# Patient Record
Sex: Female | Born: 1970
Health system: Southern US, Community
[De-identification: ages and names within clinical notes are randomized; demographics above are authoritative.]

## PROBLEM LIST (undated history)

## (undated) DIAGNOSIS — T7840XA Allergy, unspecified, initial encounter: Secondary | ICD-10-CM

## (undated) DIAGNOSIS — F329 Major depressive disorder, single episode, unspecified: Secondary | ICD-10-CM

## (undated) DIAGNOSIS — L700 Acne vulgaris: Secondary | ICD-10-CM

## (undated) DIAGNOSIS — F32A Depression, unspecified: Secondary | ICD-10-CM

## (undated) DIAGNOSIS — N761 Subacute and chronic vaginitis: Secondary | ICD-10-CM

## (undated) DIAGNOSIS — N390 Urinary tract infection, site not specified: Secondary | ICD-10-CM

## (undated) DIAGNOSIS — N362 Urethral caruncle: Secondary | ICD-10-CM

## (undated) DIAGNOSIS — F419 Anxiety disorder, unspecified: Secondary | ICD-10-CM

## (undated) DIAGNOSIS — Z8619 Personal history of other infectious and parasitic diseases: Secondary | ICD-10-CM

## (undated) DIAGNOSIS — K219 Gastro-esophageal reflux disease without esophagitis: Secondary | ICD-10-CM

## (undated) DIAGNOSIS — M1990 Unspecified osteoarthritis, unspecified site: Secondary | ICD-10-CM

## (undated) HISTORY — DX: Unspecified osteoarthritis, unspecified site: M19.90

## (undated) HISTORY — DX: Urinary tract infection, site not specified: N39.0

## (undated) HISTORY — DX: Depression, unspecified: F32.A

## (undated) HISTORY — DX: Anxiety disorder, unspecified: F41.9

## (undated) HISTORY — DX: Acne vulgaris: L70.0

## (undated) HISTORY — DX: Major depressive disorder, single episode, unspecified: F32.9

## (undated) HISTORY — DX: Allergy, unspecified, initial encounter: T78.40XA

## (undated) HISTORY — DX: Gastro-esophageal reflux disease without esophagitis: K21.9

## (undated) HISTORY — DX: Urethral caruncle: N36.2

## (undated) HISTORY — DX: Subacute and chronic vaginitis: N76.1

## (undated) HISTORY — DX: Personal history of other infectious and parasitic diseases: Z86.19

---

## 2005-01-08 ENCOUNTER — Emergency Department: Payer: Self-pay | Admitting: Emergency Medicine

## 2005-07-25 ENCOUNTER — Inpatient Hospital Stay: Payer: Self-pay

## 2007-11-07 ENCOUNTER — Ambulatory Visit: Payer: Self-pay | Admitting: Obstetrics and Gynecology

## 2008-07-30 ENCOUNTER — Other Ambulatory Visit: Admission: RE | Admit: 2008-07-30 | Discharge: 2008-07-30 | Payer: Self-pay | Admitting: Obstetrics and Gynecology

## 2008-07-30 ENCOUNTER — Ambulatory Visit: Payer: Self-pay | Admitting: Obstetrics and Gynecology

## 2008-07-30 ENCOUNTER — Encounter: Payer: Self-pay | Admitting: Obstetrics and Gynecology

## 2010-05-04 ENCOUNTER — Encounter (INDEPENDENT_AMBULATORY_CARE_PROVIDER_SITE_OTHER): Payer: Medicaid Other | Admitting: Obstetrics and Gynecology

## 2010-05-04 ENCOUNTER — Other Ambulatory Visit (HOSPITAL_COMMUNITY)
Admission: RE | Admit: 2010-05-04 | Discharge: 2010-05-04 | Disposition: A | Discharge status: Home / Self Care | Payer: Medicaid Other | Source: Ambulatory Visit | Attending: Obstetrics and Gynecology | Admitting: Obstetrics and Gynecology

## 2010-05-04 ENCOUNTER — Other Ambulatory Visit: Payer: Self-pay | Admitting: Obstetrics and Gynecology

## 2010-05-04 DIAGNOSIS — Z3041 Encounter for surveillance of contraceptive pills: Secondary | ICD-10-CM

## 2010-05-04 DIAGNOSIS — Z124 Encounter for screening for malignant neoplasm of cervix: Secondary | ICD-10-CM | POA: Insufficient documentation

## 2010-05-24 ENCOUNTER — Ambulatory Visit (INDEPENDENT_AMBULATORY_CARE_PROVIDER_SITE_OTHER): Payer: Medicaid Other | Admitting: Women's Health

## 2010-05-24 DIAGNOSIS — N920 Excessive and frequent menstruation with regular cycle: Secondary | ICD-10-CM

## 2010-05-24 DIAGNOSIS — F329 Major depressive disorder, single episode, unspecified: Secondary | ICD-10-CM

## 2010-05-24 DIAGNOSIS — N943 Premenstrual tension syndrome: Secondary | ICD-10-CM

## 2014-07-28 ENCOUNTER — Encounter: Payer: Self-pay | Admitting: Obstetrics and Gynecology

## 2014-10-26 ENCOUNTER — Other Ambulatory Visit: Payer: Self-pay | Admitting: Family Medicine

## 2014-10-26 DIAGNOSIS — R1011 Right upper quadrant pain: Secondary | ICD-10-CM

## 2014-10-29 ENCOUNTER — Ambulatory Visit
Admission: RE | Admit: 2014-10-29 | Discharge: 2014-10-29 | Disposition: A | Discharge status: Home / Self Care | Payer: BLUE CROSS/BLUE SHIELD | Source: Ambulatory Visit | Attending: Family Medicine | Admitting: Family Medicine

## 2014-10-29 DIAGNOSIS — R1011 Right upper quadrant pain: Secondary | ICD-10-CM | POA: Insufficient documentation

## 2015-01-13 ENCOUNTER — Ambulatory Visit (INDEPENDENT_AMBULATORY_CARE_PROVIDER_SITE_OTHER): Payer: BLUE CROSS/BLUE SHIELD | Admitting: Family Medicine

## 2015-01-13 ENCOUNTER — Encounter: Payer: Self-pay | Admitting: Family Medicine

## 2015-01-13 VITALS — BP 110/74 | HR 72 | Temp 98.3°F | Ht 66.25 in | Wt 175.2 lb

## 2015-01-13 DIAGNOSIS — F3281 Premenstrual dysphoric disorder: Secondary | ICD-10-CM | POA: Insufficient documentation

## 2015-01-13 DIAGNOSIS — L682 Localized hypertrichosis: Secondary | ICD-10-CM

## 2015-01-13 DIAGNOSIS — Z8249 Family history of ischemic heart disease and other diseases of the circulatory system: Secondary | ICD-10-CM | POA: Insufficient documentation

## 2015-01-13 DIAGNOSIS — G4700 Insomnia, unspecified: Secondary | ICD-10-CM

## 2015-01-13 DIAGNOSIS — B3731 Acute candidiasis of vulva and vagina: Secondary | ICD-10-CM

## 2015-01-13 DIAGNOSIS — L700 Acne vulgaris: Secondary | ICD-10-CM

## 2015-01-13 DIAGNOSIS — D259 Leiomyoma of uterus, unspecified: Secondary | ICD-10-CM | POA: Insufficient documentation

## 2015-01-13 DIAGNOSIS — K219 Gastro-esophageal reflux disease without esophagitis: Secondary | ICD-10-CM

## 2015-01-13 DIAGNOSIS — B373 Candidiasis of vulva and vagina: Secondary | ICD-10-CM

## 2015-01-13 DIAGNOSIS — F5104 Psychophysiologic insomnia: Secondary | ICD-10-CM | POA: Insufficient documentation

## 2015-01-13 DIAGNOSIS — M2550 Pain in unspecified joint: Secondary | ICD-10-CM | POA: Diagnosis not present

## 2015-01-13 DIAGNOSIS — J309 Allergic rhinitis, unspecified: Secondary | ICD-10-CM | POA: Insufficient documentation

## 2015-01-13 DIAGNOSIS — L678 Other hair color and hair shaft abnormalities: Secondary | ICD-10-CM | POA: Insufficient documentation

## 2015-01-13 LAB — CBC WITH DIFFERENTIAL/PLATELET
BASOS PCT: 0.6 % (ref 0.0–3.0)
Basophils Absolute: 0 10*3/uL (ref 0.0–0.1)
EOS PCT: 1.2 % (ref 0.0–5.0)
Eosinophils Absolute: 0.1 10*3/uL (ref 0.0–0.7)
HEMATOCRIT: 42.3 % (ref 36.0–46.0)
HEMOGLOBIN: 14 g/dL (ref 12.0–15.0)
LYMPHS PCT: 32.5 % (ref 12.0–46.0)
Lymphs Abs: 1.9 10*3/uL (ref 0.7–4.0)
MCHC: 33.2 g/dL (ref 30.0–36.0)
MCV: 88.1 fl (ref 78.0–100.0)
Monocytes Absolute: 0.4 10*3/uL (ref 0.1–1.0)
Monocytes Relative: 7 % (ref 3.0–12.0)
Neutro Abs: 3.4 10*3/uL (ref 1.4–7.7)
Neutrophils Relative %: 58.7 % (ref 43.0–77.0)
Platelets: 200 10*3/uL (ref 150.0–400.0)
RBC: 4.8 Mil/uL (ref 3.87–5.11)
RDW: 12.8 % (ref 11.5–15.5)
WBC: 5.8 10*3/uL (ref 4.0–10.5)

## 2015-01-13 LAB — LIPID PANEL
CHOLESTEROL: 165 mg/dL (ref 0–200)
HDL: 52.7 mg/dL (ref >39.00)
LDL CALC: 102 mg/dL — AB (ref 0–99)
NonHDL: 111.87
TRIGLYCERIDES: 47 mg/dL (ref 0.0–149.0)
Total CHOL/HDL Ratio: 3
VLDL: 9.4 mg/dL (ref 0.0–40.0)

## 2015-01-13 LAB — COMPREHENSIVE METABOLIC PANEL
ALBUMIN: 4.5 g/dL (ref 3.5–5.2)
ALT: 14 U/L (ref 0–35)
AST: 19 U/L (ref 0–37)
Alkaline Phosphatase: 46 U/L (ref 39–117)
BUN: 9 mg/dL (ref 6–23)
CALCIUM: 9.6 mg/dL (ref 8.4–10.5)
CHLORIDE: 106 meq/L (ref 96–112)
CO2: 27 mEq/L (ref 19–32)
Creatinine, Ser: 0.67 mg/dL (ref 0.40–1.20)
GFR: 102.42 mL/min (ref >60.00)
Glucose, Bld: 89 mg/dL (ref 70–99)
POTASSIUM: 4.1 meq/L (ref 3.5–5.1)
Sodium: 139 mEq/L (ref 135–145)
Total Bilirubin: 0.6 mg/dL (ref 0.2–1.2)
Total Protein: 7.2 g/dL (ref 6.0–8.3)

## 2015-01-13 LAB — SEDIMENTATION RATE: SED RATE: 15 mm/h (ref 0–22)

## 2015-01-13 LAB — T3, FREE: T3, Free: 3.2 pg/mL (ref 2.3–4.2)

## 2015-01-13 LAB — FOLLICLE STIMULATING HORMONE: FSH: 3.9 m[IU]/mL

## 2015-01-13 LAB — TSH: TSH: 1.59 u[IU]/mL (ref 0.35–4.50)

## 2015-01-13 LAB — T4, FREE: Free T4: 0.75 ng/dL (ref 0.60–1.60)

## 2015-01-13 LAB — LUTEINIZING HORMONE: LH: 2.34 m[IU]/mL

## 2015-01-13 NOTE — Assessment & Plan Note (Addendum)
Followed by [name] Dr Elvera Lennox. Just started clindamycin and tretinoin.

## 2015-01-13 NOTE — Patient Instructions (Signed)
Stop at lab on way out. Follow up appt 30 min 2 weeks.

## 2015-01-13 NOTE — Assessment & Plan Note (Signed)
Due to pain.

## 2015-01-13 NOTE — Progress Notes (Signed)
Pre visit review using our clinic review tool, if applicable. No additional management support is needed unless otherwise documented below in the visit note. 

## 2015-01-13 NOTE — Progress Notes (Signed)
Subjective:    Patient ID: Sharon Orozco, female    DOB: 1970-12-14, 45 y.o.   MRN: [id number]  HPI   45 year old female presents to establish.  She previous saw Dr. Lisabeth Pick at [place].Marland Kitchen GYN Dr. Kary Kos at [facility]. She just had  Administrative.CPX in 11/2014. Did not have lab.. Last 3 years ago.  Last PAP 09/2012 year nml.  She has trouble sleeping at night. This and the pain is her biggest concern.   Pain in hip, elbows, shoulders hands. Very stiff. Wakes her up at night multiple times.  Ongoing for 2-3 years. Up every 2-3 hours. Much worse in last 6 months.  Very stiff in AMs.. Works out after a few hours.  Occ redness and swelling in MCP joints. Has not been taking any med for pain or sleep other than herbal supplement.   Aleve causes fluid build up.  PMDD , dx in past. Feels that antidepressants (prozac and effexor) made it worse.  Mood is good and bad at times but not consistently low at point.  Seems to be associated with menses, prior to menses.  Occ SI during low periods, no HI.  She has history of issues with vaginitis  Off and on. Yeast and BV. Minimal itching at this time. Usually occurs 1 week prior to menses.  Mother went through menopause at age 46.Marland Kitchen She wonders if she is perimenopausal. She has very regular menses, no hot flashes. No heavy menses at this time.  She has noted facial hair growth and acne in last 2-3 years. She has noted intermittent reflux at night, 4/7 days a week.  She had [place] LUQ, neg.   Social History /Family History/Past Medical History reviewed and updated if needed.      Review of Systems  All other systems reviewed and are negative.      Objective:   Physical Exam  Constitutional: Vital signs are normal. She appears well-developed and well-nourished. She is cooperative.  Non-toxic appearance. She does not appear ill. No distress.  HENT:  Head: Normocephalic.  Right Ear: Hearing, tympanic membrane, external  ear and ear canal normal.  Left Ear: Hearing, tympanic membrane, external ear and ear canal normal.  Nose: Nose normal.  Eyes: Conjunctivae, EOM and lids are normal. Pupils are equal, round, and reactive to light. Lids are everted and swept, no foreign bodies found.  Neck: Trachea normal and normal range of motion. Neck supple. Carotid bruit is not present. No thyroid mass and no thyromegaly present.  Cardiovascular: Normal rate, regular rhythm, S1 normal, S2 normal, normal heart sounds and intact distal pulses.  Exam reveals no gallop.   No murmur heard. Pulmonary/Chest: Effort normal and breath sounds normal. No respiratory distress. She has no wheezes. She has no rhonchi. She has no rales.  Abdominal: Soft. Normal appearance and bowel sounds are normal. She exhibits no distension, no fluid wave, no abdominal bruit and no mass. There is no hepatosplenomegaly. There is no tenderness. There is no rebound, no guarding and no CVA tenderness. No hernia.  Musculoskeletal:  No swelling noted in any joints.  Lymphadenopathy:    She has no cervical adenopathy.    She has no axillary adenopathy.  Neurological: She is alert. She has normal strength. No cranial nerve deficit or sensory deficit.  Skin: Skin is warm, dry and intact. No rash noted.  Psychiatric: Her speech is normal and behavior is normal. Judgment normal. Her mood appears not anxious. Cognition and memory  are normal. She does not exhibit a depressed mood.          Assessment & Plan:

## 2015-01-13 NOTE — Assessment & Plan Note (Addendum)
Concerning for autoimmune disease. Will start lab eval.

## 2015-01-14 LAB — CYCLIC CITRUL PEPTIDE ANTIBODY, IGG: Cyclic Citrullin Peptide Ab: <16 U

## 2015-01-14 LAB — ANA: Anti Nuclear Antibody(ANA): NEGATIVE

## 2015-01-14 LAB — RHEUMATOID FACTOR: Rhuematoid fact SerPl-aCnc: <10 IU/mL (ref <=14)

## 2015-01-18 LAB — DHEA: DHEA: 129 ng/dL (ref 102–1185)

## 2015-01-20 LAB — TESTOSTERONE, FREE, TOTAL, SHBG
Sex Hormone Binding: 104 nmol/L (ref 17–124)
TESTOSTERONE FREE: 2.2 pg/mL (ref 0.6–6.8)
TESTOSTERONE-% FREE: 0.8 % (ref 0.4–2.4)
TESTOSTERONE: 28 ng/dL

## 2015-01-28 ENCOUNTER — Encounter: Payer: Self-pay | Admitting: Family Medicine

## 2015-01-28 ENCOUNTER — Other Ambulatory Visit: Payer: Self-pay | Admitting: Family Medicine

## 2015-01-28 ENCOUNTER — Ambulatory Visit (INDEPENDENT_AMBULATORY_CARE_PROVIDER_SITE_OTHER): Payer: BLUE CROSS/BLUE SHIELD | Admitting: Family Medicine

## 2015-01-28 VITALS — BP 110/78 | HR 65 | Temp 98.3°F | Ht 66.25 in | Wt 176.2 lb

## 2015-01-28 DIAGNOSIS — B373 Candidiasis of vulva and vagina: Secondary | ICD-10-CM | POA: Diagnosis not present

## 2015-01-28 DIAGNOSIS — F5104 Psychophysiologic insomnia: Secondary | ICD-10-CM

## 2015-01-28 DIAGNOSIS — L682 Localized hypertrichosis: Secondary | ICD-10-CM

## 2015-01-28 DIAGNOSIS — L678 Other hair color and hair shaft abnormalities: Secondary | ICD-10-CM

## 2015-01-28 DIAGNOSIS — M2550 Pain in unspecified joint: Secondary | ICD-10-CM

## 2015-01-28 DIAGNOSIS — B3731 Acute candidiasis of vulva and vagina: Secondary | ICD-10-CM

## 2015-01-28 DIAGNOSIS — G4700 Insomnia, unspecified: Secondary | ICD-10-CM | POA: Diagnosis not present

## 2015-01-28 LAB — VITAMIN B12: Vitamin B-12: 951 pg/mL — ABNORMAL HIGH (ref 211–911)

## 2015-01-28 MED ORDER — HYDROCHLOROTHIAZIDE 25 MG PO TABS: 25.0000 mg | ORAL_TABLET | Freq: Every day | ORAL | Status: DC | PRN

## 2015-01-28 MED ORDER — DICLOFENAC SODIUM 75 MG PO TBEC: 75.0000 mg | DELAYED_RELEASE_TABLET | Freq: Two times a day (BID) | ORAL | Status: DC

## 2015-01-28 NOTE — Progress Notes (Signed)
Pre visit review using our clinic review tool, if applicable. No additional management support is needed unless otherwise documented below in the visit note. 

## 2015-01-28 NOTE — Assessment & Plan Note (Signed)
Will try 2 week trial of boric acid.

## 2015-01-28 NOTE — Progress Notes (Signed)
Subjective:    Patient ID: Sharon Orozco, female    DOB: [birth year], 45 y.o.   MRN: [id number]  HPI  45 year old female with insomnia and joint pain returns for 2 weeks follow up.  Summary of last OV HPI: She has trouble sleeping at night. This and the pain is her biggest concern.  Pain in hip, elbows, shoulders hands. Very stiff. Wakes her up at night multiple times. Ongoing for 2-3 years. Up every 2-3 hours. Much worse in last 6 months. Very stiff in AMs.. Works out after a few hours. Occ redness and swelling in MCP joints. Has not been taking any med for pain or sleep other than herbal supplement.  Aleve causes fluid build up.  She has noted facial hair growth and acne in last 2-3 years.  PMDD , dx in past. Feels that antidepressants (prozac and effexor) made it worse. Mood is good and bad at times but not consistently low at point. Seems to be associated with menses, prior to menses. Occ SI during low periods, no HI.  At last OV she had labs to eval for autoimmine disease cause of joint pain. RF neg, sed rate nml, ANA neg,anticcp antibody neg, tsh and free t3, T4 nml, cbc nml  CMET and lipids nml. LDL at goal < 130-160  Also to eval for PCOS (acne and hirsutism): DHEA nml, LH/FSH ratio 0.6 making PCOS unlikely.  Today she reports she is still having severe joint pain and stiffness. This continue to cause her difficulty to sleep. Only thing she can remember prior to joint issues.. Was UTI frequently and lots of antibiotics. ? If she had strep throat  No psoriasis. Exercising 30 min a day, trouble losing weight. SE with meloxicam in past.. Water retention.  She continues to have monthly yeast infection prior to menses.     Review of Systems  Constitutional: Positive for fatigue. Negative for fever.  HENT: Negative for ear pain.   Eyes: Negative for pain.  Respiratory: Negative for cough and shortness of breath.   Cardiovascular: Positive for leg swelling.    Gastrointestinal: Negative for abdominal pain.       Objective:   Physical Exam  Constitutional: Vital signs are normal. She appears well-developed and well-nourished. She is cooperative.  Non-toxic appearance. She does not appear ill. No distress.  HENT:  Head: Normocephalic.  Right Ear: Hearing, tympanic membrane, external ear and ear canal normal. Tympanic membrane is not erythematous, not retracted and not bulging.  Left Ear: Hearing, tympanic membrane, external ear and ear canal normal. Tympanic membrane is not erythematous, not retracted and not bulging.  Nose: No mucosal edema or rhinorrhea. Right sinus exhibits no maxillary sinus tenderness and no frontal sinus tenderness. Left sinus exhibits no maxillary sinus tenderness and no frontal sinus tenderness.  Mouth/Throat: Uvula is midline, oropharynx is clear and moist and mucous membranes are normal.  Eyes: Conjunctivae, EOM and lids are normal. Pupils are equal, round, and reactive to light. Lids are everted and swept, no foreign bodies found.  Neck: Trachea normal and normal range of motion. Neck supple. Carotid bruit is not present. No thyroid mass and no thyromegaly present.  Cardiovascular: Normal rate, regular rhythm, S1 normal, S2 normal, normal heart sounds, intact distal pulses and normal pulses.  Exam reveals no gallop and no friction rub.   No murmur heard. Pulmonary/Chest: Effort normal and breath sounds normal. No tachypnea. No respiratory distress. She has no decreased breath sounds. She has no wheezes.  She has no rhonchi. She has no rales.  Abdominal: Soft. Normal appearance and bowel sounds are normal. There is no tenderness.  Musculoskeletal:       Right elbow: She exhibits no swelling. Tenderness found.       Left elbow: She exhibits normal range of motion. Tenderness found.       Right hip: She exhibits decreased range of motion and bony tenderness.       Left hip: She exhibits decreased range of motion, tenderness  and bony tenderness.       Right hand: She exhibits tenderness. She exhibits normal range of motion.       Left hand: She exhibits tenderness. She exhibits normal range of motion.  ttp over bilateral hip bursa, neg anterior pain, pain in hips with faber's  TTP over low back bilaterally, neg SLR multiple fibromyalgia trigger points positive.   Neurological: She is alert.  Skin: Skin is warm, dry and intact. No rash noted.  Psychiatric: Her speech is normal and behavior is normal. Judgment and thought content normal. Her mood appears not anxious. Cognition and memory are normal. She does not exhibit a depressed mood.          Assessment & Plan:

## 2015-01-28 NOTE — Assessment & Plan Note (Signed)
Neg eval for RA and lupus, no rash suggesting psoriatic arthritis.  Will check lyme titer and ASO titer given ? Proceeding strep illness. ? Secondary to multiple antibitoics in past years proceeding. Will refer to rheum for further eval.   Treat with diclofenac for inflammation and can use HCTZ as needed for SE of fluid retention.

## 2015-01-28 NOTE — Assessment & Plan Note (Signed)
Due to oint pain and likely primary cause of fatigue.

## 2015-01-28 NOTE — Patient Instructions (Addendum)
Stop at lab on way out.  Stop at front desk for rheum referral.  Start diclofenac twice daily.  If fluid retention, can use low dose HCTZ  Daily. Boric acid vaginally at night x 2 weeks.

## 2015-01-28 NOTE — Assessment & Plan Note (Signed)
Nml testosteron and neg testing for PCOS.

## 2015-01-31 ENCOUNTER — Telehealth: Payer: Self-pay | Admitting: Family Medicine

## 2015-01-31 LAB — LYME AB/WESTERN BLOT REFLEX: B BURGDORFERI AB IGG+ IGM: 0.12 {ISR}

## 2015-01-31 LAB — ANTISTREPTOLYSIN O TITER: ASO: 277 IU/mL (ref <409)

## 2015-01-31 MED ORDER — FLUCONAZOLE 150 MG PO TABS: ORAL_TABLET | ORAL | Status: DC

## 2015-01-31 NOTE — Telephone Encounter (Signed)
-----   Message from [name], [place] sent at 01/31/2015  9:27 AM EST ----- Spoke with Sharon Orozco.  She states she has taken the fluconazole in the past and is willing to try that again but she feels she needs to be on it for several months to get the yeast out of her system.  Please advise.

## 2015-01-31 NOTE — Telephone Encounter (Signed)
Sent in extended course of diflucan for recurrent vulvovaginitis.

## 2015-01-31 NOTE — Telephone Encounter (Signed)
[place] notified as instructed by telephone.  She also wanted to let Dr. Diona Browner know that she will be seeing Dr. Gavin Pound PA on Friday.

## 2015-02-08 NOTE — Assessment & Plan Note (Signed)
eval for PCOS and testosterone level.

## 2015-02-08 NOTE — Assessment & Plan Note (Signed)
Eval at next OV with wet prep if symptomatic at the time.

## 2015-02-08 NOTE — Assessment & Plan Note (Signed)
Per pt worse with SSRI.

## 2015-02-28 ENCOUNTER — Ambulatory Visit (INDEPENDENT_AMBULATORY_CARE_PROVIDER_SITE_OTHER): Payer: BLUE CROSS/BLUE SHIELD | Admitting: Family Medicine

## 2015-02-28 ENCOUNTER — Encounter: Payer: Self-pay | Admitting: Family Medicine

## 2015-02-28 VITALS — BP 100/70 | HR 66 | Temp 98.5°F | Ht 66.25 in | Wt 176.8 lb

## 2015-02-28 DIAGNOSIS — B373 Candidiasis of vulva and vagina: Secondary | ICD-10-CM

## 2015-02-28 DIAGNOSIS — B3731 Acute candidiasis of vulva and vagina: Secondary | ICD-10-CM

## 2015-02-28 DIAGNOSIS — M797 Fibromyalgia: Secondary | ICD-10-CM

## 2015-02-28 DIAGNOSIS — M2550 Pain in unspecified joint: Secondary | ICD-10-CM | POA: Diagnosis not present

## 2015-02-28 DIAGNOSIS — G4700 Insomnia, unspecified: Secondary | ICD-10-CM

## 2015-02-28 DIAGNOSIS — F5104 Psychophysiologic insomnia: Secondary | ICD-10-CM

## 2015-02-28 MED ORDER — AMITRIPTYLINE HCL 25 MG PO TABS: 25.0000 mg | ORAL_TABLET | Freq: Every day | ORAL | Status: DC

## 2015-02-28 MED ORDER — FLUCONAZOLE 150 MG PO TABS: ORAL_TABLET | ORAL | Status: DC

## 2015-02-28 NOTE — Progress Notes (Signed)
Subjective:    Patient ID: Sharon Orozco, female    DOB: 11/28/1970, 45 y.o.   MRN: [id number]  HPI  45 year old female with insomnia and joint pain returns for follow up on multiple issues.   At last OV 1/27, 1 month ago, She was referred to rheumatology after negative initial work up.. For eval of joint pain. Also she was started on diclofenac for pain and inflammation.  HCTZ given for fluid retention. Diclofenac was stopped given caused SE as all other NSAIDs have per pt.  She saw rheum on 2/3, 2/20,  Dr. Trudie Reed.   Felt Dx of fibromyalgia.  No further rheum eval need as all serologies negative.  Chronic insomnia, at last OV this was felt to be due to joint issues.  PMDD: prozac,effexor, zoloft caused irritability in past.  Some anxiety, but denies depresison.  Recurrent candaidiasis of vagina: Given boric acid but this was to expensive. Instead treated with 150 mg once daily for 10 days , followed by 150 mg once weekly for 2 months.  She has had improvement of symptoms but still having  Some issues recur.. Wishes to continue 6 month course.    TODAY: She continues to have joint issue. She has started on gentle exercise program. She feels it has helped greatly. She has been hired as a Scientist, water quality. She still continue to have body pain and trouble sleeping with this issue. Fatgiued and achy through the day.    Review of Systems  Constitutional: Positive for fatigue. Negative for fever.  HENT: Negative for ear pain.   Eyes: Negative for pain.  Respiratory: Negative for chest tightness and shortness of breath.   Cardiovascular: Positive for chest pain. Negative for palpitations and leg swelling.  Gastrointestinal: Negative for abdominal pain.  Genitourinary: Negative for dysuria.  Musculoskeletal: Positive for myalgias, back pain, joint swelling and arthralgias.       Objective:   Physical Exam  Constitutional: Vital signs are normal. She appears well-developed and  well-nourished. She is cooperative.  Non-toxic appearance. She does not appear ill. No distress.  HENT:  Head: Normocephalic.  Right Ear: Hearing, tympanic membrane, external ear and ear canal normal. Tympanic membrane is not erythematous, not retracted and not bulging.  Left Ear: Hearing, tympanic membrane, external ear and ear canal normal. Tympanic membrane is not erythematous, not retracted and not bulging.  Nose: No mucosal edema or rhinorrhea. Right sinus exhibits no maxillary sinus tenderness and no frontal sinus tenderness. Left sinus exhibits no maxillary sinus tenderness and no frontal sinus tenderness.  Mouth/Throat: Uvula is midline, oropharynx is clear and moist and mucous membranes are normal.  Eyes: Conjunctivae, EOM and lids are normal. Pupils are equal, round, and reactive to light. Lids are everted and swept, no foreign bodies found.  Neck: Trachea normal and normal range of motion. Neck supple. Carotid bruit is not present. No thyroid mass and no thyromegaly present.  Cardiovascular: Normal rate, regular rhythm, S1 normal, S2 normal, normal heart sounds, intact distal pulses and normal pulses.  Exam reveals no gallop and no friction rub.   No murmur heard. Pulmonary/Chest: Effort normal and breath sounds normal. No tachypnea. No respiratory distress. She has no decreased breath sounds. She has no wheezes. She has no rhonchi. She has no rales.  Abdominal: Soft. Normal appearance and bowel sounds are normal. There is no tenderness.  Musculoskeletal:       Right elbow: She exhibits no swelling. Tenderness found.  Left elbow: She exhibits normal range of motion. Tenderness found.       Right hip: She exhibits decreased range of motion and bony tenderness.       Left hip: She exhibits decreased range of motion, tenderness and bony tenderness.       Right hand: She exhibits tenderness. She exhibits normal range of motion.       Left hand: She exhibits tenderness. She exhibits  normal range of motion.  18 /18 trigger points for fibromyalgia  Neurological: She is alert.  Skin: Skin is warm, dry and intact. No rash noted.  Psychiatric: Her speech is normal and behavior is normal. Judgment and thought content normal. Her mood appears not anxious. Cognition and memory are normal. She does not exhibit a depressed mood.          Assessment & Plan:

## 2015-02-28 NOTE — Assessment & Plan Note (Signed)
Rheum work up negative. Discussed options for treatment. She has felt SSRIs in past have made her feel worse. We will instead first try amitriptyline at bedtime. Titrate up as needed. Continue work on healthy lifestyle.

## 2015-02-28 NOTE — Patient Instructions (Addendum)
Keep working on gentle exercise. Work on [employer] and  Stress reduction/relaxation. Start amitriptyline low dose 25 mg at night, can increase to 50 mg after 1 week if not helping with sleep. Continue diflucan for now.

## 2015-02-28 NOTE — Assessment & Plan Note (Signed)
Poor control. Start amitriptyline at bedtime.

## 2015-02-28 NOTE — Progress Notes (Signed)
Pre visit review using our clinic review tool, if applicable. No additional management support is needed unless otherwise documented below in the visit note. 

## 2015-02-28 NOTE — Assessment & Plan Note (Signed)
Continue diflucan weekly for 6 months.

## 2015-03-19 ENCOUNTER — Other Ambulatory Visit: Payer: Self-pay | Admitting: Family Medicine

## 2015-03-21 ENCOUNTER — Telehealth: Payer: Self-pay

## 2015-03-21 NOTE — Telephone Encounter (Signed)
Pt left v/m;pt requesting cymbalta sent to [facility]; pt talked with her husband and has decided not to try the amitriptyline and request cymbalta instead; pt request cb when med sent to pharmacy. Pt seen 02/28/15.

## 2015-03-22 MED ORDER — DULOXETINE HCL 30 MG PO CPEP: 30.0000 mg | ORAL_CAPSULE | Freq: Every day | ORAL | Status: DC

## 2015-03-22 NOTE — Telephone Encounter (Signed)
[facility] notified by telephone that the prescription for Cymbalta has been sent to [facility] [address] as requested.

## 2015-03-22 NOTE — Telephone Encounter (Signed)
Okay to send 30 mg cymbalta 1 tab po daily # 30, 3 rf

## 2015-03-29 ENCOUNTER — Ambulatory Visit: Payer: BLUE CROSS/BLUE SHIELD | Admitting: Family Medicine

## 2015-07-07 ENCOUNTER — Other Ambulatory Visit: Payer: Self-pay | Admitting: Family Medicine

## 2015-07-07 NOTE — Telephone Encounter (Signed)
Last office visit 02/28/2015.  [facility] states to follow up in 4 weeks.  No future appointments.  Ok to refill?

## 2015-08-03 ENCOUNTER — Other Ambulatory Visit: Payer: Self-pay | Admitting: *Deleted

## 2015-08-03 MED ORDER — HYDROCHLOROTHIAZIDE 25 MG PO TABS: ORAL_TABLET | ORAL | 5 refills | Status: DC

## 2015-08-03 NOTE — Telephone Encounter (Signed)
Last office visit 02/28/2015.  [facility] states to follow up in one month.  No future appointments.  Ok to refill?

## 2015-08-04 ENCOUNTER — Other Ambulatory Visit: Payer: Self-pay | Admitting: Family Medicine

## 2015-08-04 MED ORDER — HYDROCHLOROTHIAZIDE 25 MG PO TABS: ORAL_TABLET | ORAL | 1 refills | Status: DC

## 2015-08-04 NOTE — Addendum Note (Signed)
Addended by: [name] on: 08/04/2015 04:20 PM   Modules accepted: Orders

## 2015-08-04 NOTE — Telephone Encounter (Signed)
Last office visit 02/28/2015.  [facility] states to follow up in one month.  No future appointments.  Ok to refill?

## 2015-08-04 NOTE — Telephone Encounter (Signed)
Received fax from [facility] requesting 90 day supply.  Sent as requested.

## 2015-08-11 ENCOUNTER — Ambulatory Visit: Payer: BLUE CROSS/BLUE SHIELD | Admitting: Family Medicine

## 2015-08-18 ENCOUNTER — Ambulatory Visit
Admission: EM | Admit: 2015-08-18 | Discharge: 2015-08-18 | Disposition: A | Discharge status: Home / Self Care | Payer: BLUE CROSS/BLUE SHIELD | Attending: Family Medicine | Admitting: Family Medicine

## 2015-08-18 DIAGNOSIS — N760 Acute vaginitis: Secondary | ICD-10-CM

## 2015-08-18 DIAGNOSIS — B373 Candidiasis of vulva and vagina: Secondary | ICD-10-CM

## 2015-08-18 DIAGNOSIS — B3731 Acute candidiasis of vulva and vagina: Secondary | ICD-10-CM

## 2015-08-18 LAB — CHLAMYDIA/NGC RT PCR (ARMC ONLY)
CHLAMYDIA TR: NOT DETECTED
N GONORRHOEAE: NOT DETECTED

## 2015-08-18 LAB — WET PREP, GENITAL
Clue Cells Wet Prep HPF POC: NONE SEEN
SPERM: NONE SEEN
TRICH WET PREP: NONE SEEN
YEAST WET PREP: NONE SEEN

## 2015-08-18 LAB — URINALYSIS COMPLETE WITH MICROSCOPIC (ARMC ONLY)
BACTERIA UA: NONE SEEN
Bilirubin Urine: NEGATIVE
GLUCOSE, UA: NEGATIVE mg/dL
Hgb urine dipstick: NEGATIVE
Ketones, ur: NEGATIVE mg/dL
Leukocytes, UA: NEGATIVE
Nitrite: NEGATIVE
PROTEIN: NEGATIVE mg/dL
RBC / HPF: NONE SEEN RBC/hpf (ref 0–5)
Specific Gravity, Urine: <1.005 — ABNORMAL LOW (ref 1.005–1.030)
WBC UA: NONE SEEN WBC/hpf (ref 0–5)
pH: 5 (ref 5.0–8.0)

## 2015-08-18 MED ORDER — METRONIDAZOLE 500 MG PO TABS: 500.0000 mg | ORAL_TABLET | Freq: Two times a day (BID) | ORAL | 0 refills | Status: DC

## 2015-08-18 MED ORDER — TERCONAZOLE 80 MG VA SUPP: 80.0000 mg | Freq: Every day | VAGINAL | 0 refills | Status: DC

## 2015-08-18 MED ORDER — KETOCONAZOLE 2 % EX CREA: 1.0000 "application " | TOPICAL_CREAM | Freq: Two times a day (BID) | CUTANEOUS | 1 refills | Status: DC

## 2015-08-18 MED ORDER — FLUCONAZOLE 150 MG PO TABS
150.0000 mg | ORAL_TABLET | Freq: Once | ORAL | 0 refills | Status: AC
Start: 2015-08-18 — End: 2015-08-18

## 2015-08-18 NOTE — ED Provider Notes (Addendum)
[facility]    CSN: [id number] Arrival date & time: 08/18/15  [id number]  First Provider Contact:  First MD Initiated Contact with Patient 08/18/15 1619        History   Chief Complaint Chief Complaint  Patient presents with  . Vaginal Itching    Burning, Redness    HPI Sharon Orozco is a 45 y.o. female.   Patient's been having recurrent bacterial vaginosis/yeast infections. She's been treated with vaginal clindamycin she's been treated with Diflucan and continues to have trouble. Almost monthly she'll have discharge. In the last week and to she's had increased trouble with the discharge. At this point time she is quite frustrated can get into see her PCP she took a Diflucan earlier today because she was having irritation and came in to be seen and evaluated. She is a gravida 1 para 1. Her husband has had a vasectomy she's been in a marriage the last 13 years he's never been treated for BV though he is she's had multiple episodes of BV before. She was just treated for clindamycin vaginal cream recently as well.  She has a history of fibromyalgia she does not smoke. She's had a history uterine fibroid active rhinitis. Acne vulgaris and some chronic insomnia with depression. Her family is positive for coronary artery disease and she is a former smoker.   The history is provided by the patient. No language interpreter was used.  Vaginal Itching  This is a recurrent problem. The problem occurs constantly. The problem has been gradually worsening. Pertinent negatives include no chest pain, no abdominal pain, no headaches and no shortness of breath. Nothing aggravates the symptoms. Nothing relieves the symptoms. Treatments tried: She's use clindamycin vaginal cream and she is currently using Diflucan. The treatment provided no relief.    Past Medical History:  Diagnosis Date  . Acne vulgaris   . Allergy   . Anxiety   . Arthritis   . Chronic vaginitis   . Depression   . GERD  (gastroesophageal reflux disease)   . History of chicken pox   . Urethral polyp   . Urinary tract infection     Patient Active Problem List   Diagnosis Date Noted  . Fibromyalgia syndrome 02/28/2015  . Recurrent candidiasis of vagina 01/28/2015  . Chronic insomnia 01/13/2015  . PMDD (premenstrual dysphoric disorder) 01/13/2015  . Uterine fibroid 01/13/2015  . GERD (gastroesophageal reflux disease) 01/13/2015  . Allergic rhinitis 01/13/2015  . Acne vulgaris 01/13/2015  . Abnormal facial hair 01/13/2015  . Family history of premature CAD 01/13/2015    History reviewed. No pertinent surgical history.  OB History    No data available       Home Medications    Prior to Admission medications   Medication Sig Start Date End Date Taking? Authorizing Provider  amitriptyline (ELAVIL) 25 MG tablet Take 1 tablet (25 mg total) by mouth at bedtime. If tolerating after 1 week but  Needing more improvement, can increase to 50 mg at bedtime. 02/28/15  Yes Nykira E Diona Browner, MD  DULoxetine (CYMBALTA) 30 MG capsule TAKE 1 CAPSULE EVERY DAY 08/04/15  Yes Matthew E Bedsole, MD  fluconazole (DIFLUCAN) 150 MG tablet 50 mg once weekly for 6 months 02/28/15  Yes Jhanae E Bedsole, MD  hydrochlorothiazide (HYDRODIURIL) 25 MG tablet TAKE 1 TABLET (25 MG TOTAL) BY MOUTH DAILY AS NEEDED FOR SWELLING 08/04/15  Yes Laiklynn Cletis [place], MD  Multiple Vitamins-Minerals (MULTIVITAMIN WOMEN) TABS Take 1 tablet by mouth  daily.   Yes Historical Provider, MD  tretinoin (RETIN-A) 0.05 % cream Apply topically at bedtime.  01/06/15  Yes Historical Provider, MD  clindamycin (CLEOCIN T) 1 % lotion APPLY THIN LAYER TO FACE EVERY MORNING 12/31/14   Historical Provider, MD  fluconazole (DIFLUCAN) 150 MG tablet Take 1 tablet (150 mg total) by mouth once. 08/18/15 08/18/15  Frederich Cha, MD  metroNIDAZOLE (FLAGYL) 500 MG tablet Take 1 tablet (500 mg total) by mouth 2 (two) times daily. 08/18/15   Frederich Cha, MD  terconazole (TERAZOL 3) 80 MG vaginal  suppository Place 1 suppository (80 mg total) vaginally at bedtime. 08/18/15   Frederich Cha, MD    Family History Family History  Problem Relation Age of Onset  . Adopted: Yes  . Heart disease Father 26     MI  . Hypertension Father   . Hyperlipidemia Father   . Lymphoma Maternal Grandmother   . Kidney disease Maternal Grandmother   . Heart disease Paternal Grandfather   . Congestive Heart Failure Paternal Grandfather   . Arthritis Mother   . Hyperlipidemia Mother   . Hypertension Mother   . Alcohol abuse Paternal Grandmother   . Stroke Paternal Grandmother   . Thyroid disease Sister   . Panic disorder Sister     Social History Social History  Substance Use Topics  . Smoking status: Former Research scientist (life sciences)  . Smokeless tobacco: Never Used  . Alcohol use 0.0 oz/week     Comment: occ glass of wine     Allergies   Diclofenac and Sulfa antibiotics   Review of Systems Review of Systems  Respiratory: Negative for shortness of breath.   Cardiovascular: Negative for chest pain.  Gastrointestinal: Negative for abdominal pain.  Neurological: Negative for headaches.  All other systems reviewed and are negative.    Physical Exam Triage Vital Signs ED Triage Vitals  Enc Vitals Group     BP 08/18/15 1635 109/78     Pulse Rate 08/18/15 1635 70     Resp 08/18/15 1635 18     Temp 08/18/15 1635 98 F (36.7 C)     Temp Source 08/18/15 1635 Oral     SpO2 08/18/15 1635 98 %     Weight 08/18/15 1632 170 lb (77.1 kg)     Height 08/18/15 1632 5\' 6"  (1.676 m)     Head Circumference --      Peak Flow --      Pain Score 08/18/15 1634 8     Pain Loc --      Pain Edu? --      Excl. in [place]? --    No data found.   Updated Vital Signs BP 109/78 (BP Location: Right Arm)   Pulse 70   Temp 98 F (36.7 C) (Oral)   Resp 18   Ht 5\' 6"  (1.676 m)   Wt 170 lb (77.1 kg)   LMP 08/09/2015   SpO2 98%   BMI 27.44 kg/m   Visual Acuity Right Eye Distance:   Left Eye Distance:   Bilateral  Distance:    Right Eye Near:   Left Eye Near:    Bilateral Near:     Physical Exam  Constitutional: She is oriented to person, place, and time. She appears well-developed and well-nourished.  HENT:  Head: Normocephalic and atraumatic.  Eyes: Pupils are equal, round, and reactive to light.  Neck: Normal range of motion.  Pulmonary/Chest: Effort normal.  Abdominal: Soft. She exhibits no distension. There is no  tenderness. There is no guarding.  Genitourinary: Rectum normal and uterus normal. Rectal exam shows no external hemorrhoid, no internal hemorrhoid, no fissure, no mass, no tenderness and anal tone normal.    Pelvic exam was performed with patient prone. There is rash on the right labia. There is rash on the left labia. Right adnexum displays no mass and no tenderness. Left adnexum displays no mass and no tenderness. Vaginal discharge found.  Genitourinary Comments: Increased redness over the introitus  Musculoskeletal: Normal range of motion.  Neurological: She is alert and oriented to person, place, and time.  Skin: Skin is warm.  Psychiatric: She has a normal mood and affect.  Vitals reviewed.    UC Treatments / Results  Labs (all labs ordered are listed, but only abnormal results are displayed) Labs Reviewed  WET PREP, GENITAL - Abnormal; Notable for the following:       Result Value   WBC, Wet Prep HPF POC FEW (*)    All other components within normal limits  URINALYSIS COMPLETEWITH MICROSCOPIC ([facility] ONLY) - Abnormal; Notable for the following:    Color, Urine STRAW (*)    Specific Gravity, Urine <1.005 (*)    Squamous Epithelial / LPF 0-5 (*)    All other components within normal limits  CHLAMYDIA/NGC RT PCR [facility] ONLY)    EKG  EKG Interpretation None       Radiology No results found.  Procedures Procedures (including critical care time)  Medications Ordered in UC Medications - No data to display   Initial Impression / Assessment and  Plan / UC Course  I have reviewed the triage vital signs and the nursing notes.  Pertinent labs & imaging results that were available during my care of the patient were reviewed by me and considered in my medical decision making (see chart for details). Results for orders placed or performed during the hospital encounter of 08/18/15  Wet prep, genital  Result Value Ref Range   Yeast Wet Prep HPF POC NONE SEEN NONE SEEN   Trich, Wet Prep NONE SEEN NONE SEEN   Clue Cells Wet Prep HPF POC NONE SEEN NONE SEEN   WBC, Wet Prep HPF POC FEW (A) NONE SEEN   Sperm NONE SEEN   Urinalysis complete, with microscopic  Result Value Ref Range   Color, Urine STRAW (A) YELLOW   APPearance CLEAR CLEAR   Glucose, UA NEGATIVE NEGATIVE mg/dL   Bilirubin Urine NEGATIVE NEGATIVE   Ketones, ur NEGATIVE NEGATIVE mg/dL   Specific Gravity, Urine <1.005 (L) 1.005 - 1.030   Hgb urine dipstick NEGATIVE NEGATIVE   pH 5.0 5.0 - 8.0   Protein, ur NEGATIVE NEGATIVE mg/dL   Nitrite NEGATIVE NEGATIVE   Leukocytes, UA NEGATIVE NEGATIVE   RBC / HPF NONE SEEN 0 - 5 RBC/hpf   WBC, UA NONE SEEN 0 - 5 WBC/hpf   Bacteria, UA NONE SEEN NONE SEEN   Squamous Epithelial / LPF 0-5 (A) NONE SEEN   Clinical Course   Patient was informed that because of the use of Diflucan clindamycin about 3 weeks ago but can't say for sure she has either bacterial vaginosis or but we can't say that she does have a discharge. With a benign will going to go ahead and treat aggressively with Terazol vaginal suppositories 80 mg Diflucan as well and will place her on Flagyl 500 mg twice a day for 10 days. Her husband has come in and they've discussed and they want me to evaluate him  as well. Will give work note for today so   Final Clinical Impressions(s) / UC Diagnoses   Final diagnoses:  Vaginitis  Yeast vaginitis    New Prescriptions New Prescriptions   FLUCONAZOLE (DIFLUCAN) 150 MG TABLET    Take 1 tablet (150 mg total) by mouth once.    METRONIDAZOLE (FLAGYL) 500 MG TABLET    Take 1 tablet (500 mg total) by mouth 2 (two) times daily.   TERCONAZOLE (TERAZOL 3) 80 MG VAGINAL SUPPOSITORY    Place 1 suppository (80 mg total) vaginally at bedtime.   Patient also reported having rotation the introitus explained to her this could be with yeast continues to recur thus would recommend replacement Nizoral cream to apply to vaginal outside area twice a day for 2 weeks.   Note: This dictation was prepared with Dragon dictation along with smaller phrase technology. Any transcriptional errors that result from this process are unintentional.   Frederich Cha, MD 08/18/15 [place], MD 08/18/15 [phone]

## 2015-08-18 NOTE — ED Triage Notes (Signed)
Patient complains of a yeast infection that has persisted over a few weeks, she has talked with a MD via telephone and they prescribed Clindamycin vaginally, it helped some but then she started to burn and have redness along with a fishy smell. She did take a Diflucan this am

## 2015-09-20 ENCOUNTER — Other Ambulatory Visit: Payer: Self-pay | Admitting: Obstetrics & Gynecology

## 2015-09-20 DIAGNOSIS — Z01419 Encounter for gynecological examination (general) (routine) without abnormal findings: Secondary | ICD-10-CM | POA: Diagnosis not present

## 2015-09-20 DIAGNOSIS — N761 Subacute and chronic vaginitis: Secondary | ICD-10-CM | POA: Insufficient documentation

## 2015-09-20 DIAGNOSIS — N951 Menopausal and female climacteric states: Secondary | ICD-10-CM | POA: Diagnosis not present

## 2015-09-20 DIAGNOSIS — N760 Acute vaginitis: Secondary | ICD-10-CM | POA: Diagnosis not present

## 2015-09-20 DIAGNOSIS — Z1231 Encounter for screening mammogram for malignant neoplasm of breast: Secondary | ICD-10-CM

## 2015-09-20 LAB — HM PAP SMEAR

## 2015-09-22 ENCOUNTER — Ambulatory Visit
Admission: RE | Admit: 2015-09-22 | Discharge: 2015-09-22 | Disposition: A | Discharge status: Home / Self Care | Payer: BLUE CROSS/BLUE SHIELD | Source: Ambulatory Visit | Attending: Obstetrics & Gynecology | Admitting: Obstetrics & Gynecology

## 2015-09-22 ENCOUNTER — Other Ambulatory Visit: Payer: Self-pay | Admitting: Obstetrics & Gynecology

## 2015-09-22 DIAGNOSIS — Z1231 Encounter for screening mammogram for malignant neoplasm of breast: Secondary | ICD-10-CM

## 2015-10-08 DIAGNOSIS — Z23 Encounter for immunization: Secondary | ICD-10-CM | POA: Diagnosis not present

## 2016-03-02 ENCOUNTER — Other Ambulatory Visit: Payer: Self-pay | Admitting: Family Medicine

## 2016-08-03 DIAGNOSIS — N920 Excessive and frequent menstruation with regular cycle: Secondary | ICD-10-CM | POA: Diagnosis not present

## 2016-08-03 DIAGNOSIS — N898 Other specified noninflammatory disorders of vagina: Secondary | ICD-10-CM | POA: Diagnosis not present

## 2016-08-03 DIAGNOSIS — R399 Unspecified symptoms and signs involving the genitourinary system: Secondary | ICD-10-CM | POA: Diagnosis not present

## 2016-09-14 ENCOUNTER — Encounter: Payer: BLUE CROSS/BLUE SHIELD | Admitting: Family Medicine

## 2016-09-19 DIAGNOSIS — H5213 Myopia, bilateral: Secondary | ICD-10-CM | POA: Diagnosis not present

## 2016-09-25 ENCOUNTER — Encounter: Payer: BLUE CROSS/BLUE SHIELD | Admitting: Family Medicine

## 2016-09-25 ENCOUNTER — Encounter: Payer: Self-pay | Admitting: Family Medicine

## 2016-09-25 ENCOUNTER — Ambulatory Visit (INDEPENDENT_AMBULATORY_CARE_PROVIDER_SITE_OTHER): Payer: BC Managed Care – PPO | Admitting: Family Medicine

## 2016-09-25 VITALS — BP 110/82 | HR 71 | Temp 98.3°F | Ht 65.5 in | Wt 172.8 lb

## 2016-09-25 DIAGNOSIS — Z13220 Encounter for screening for lipoid disorders: Secondary | ICD-10-CM

## 2016-09-25 DIAGNOSIS — Z0000 Encounter for general adult medical examination without abnormal findings: Secondary | ICD-10-CM

## 2016-09-25 DIAGNOSIS — M654 Radial styloid tenosynovitis [de Quervain]: Secondary | ICD-10-CM

## 2016-09-25 DIAGNOSIS — Z23 Encounter for immunization: Secondary | ICD-10-CM

## 2016-09-25 MED ORDER — TETANUS-DIPHTH-ACELL PERTUSSIS 5-2.5-18.5 LF-MCG/0.5 IM SUSP: 0.5000 mL | Freq: Once | INTRAMUSCULAR | Status: DC

## 2016-09-25 NOTE — Patient Instructions (Addendum)
Please stop at the lab to have labs drawn.  Wear thumb spica splint on right wrist. Use ibuproifen 800 mg every 8 hours as neeeded for pain in wrist x 3-4 days.  Start home PT and ice on wrist.

## 2016-09-25 NOTE — Progress Notes (Signed)
Subjective:    Patient ID: Sharon Orozco, female    DOB: June 06, 1970, 46 y.o.   MRN: [id number]  HPI   The patient is here for annual wellness exam and preventative care.   She is now working as a Scientist, water quality.. She is having pain in right wrist in last week.. Pain in inner wrist an up arm and in thumb. No numbness, no tingling.   HAs tried wrap, ice. Not used any med to treat.    Mood much improved. PHQ2: 0  Sees GYN at [facility], Dr. Leonides Schanz for GYN exam yearly.. last 09/2015  Now on OCPs.  Check yearly CMET and cbc   She is allergic  to latex needs note  For work. Rash and itching at wrist off and on.   Blood pressure 110/82, pulse 71, temperature 98.3 F (36.8 C), temperature source Oral, height 5' 5.5" (1.664 m), weight 172 lb 12 oz (78.4 kg), last menstrual period 08/31/2016. Social History /Family History/Past Medical History reviewed in detail and updated in EMR if needed.  Review of Systems  Constitutional: Negative for fatigue and fever.  HENT: Negative for congestion.   Eyes: Negative for pain.  Respiratory: Negative for cough and shortness of breath.   Cardiovascular: Negative for chest pain, palpitations and leg swelling.  Gastrointestinal: Negative for abdominal pain.  Genitourinary: Negative for dysuria and vaginal bleeding.  Musculoskeletal: Negative for back pain.  Neurological: Negative for syncope, light-headedness and headaches.  Psychiatric/Behavioral: Negative for dysphoric mood.       Objective:   Physical Exam  Constitutional: Vital signs are normal. She appears well-developed and well-nourished. She is cooperative.  Non-toxic appearance. She does not appear ill. No distress.  HENT:  Head: Normocephalic.  Right Ear: Hearing, tympanic membrane, external ear and ear canal normal.  Left Ear: Hearing, tympanic membrane, external ear and ear canal normal.  Nose: Nose normal.  Eyes: Pupils are equal, round, and reactive to light. Conjunctivae, EOM and lids are normal.  Lids are everted and swept, no foreign bodies found.  Neck: Trachea normal and normal range of motion. Neck supple. Carotid bruit is not present. No thyroid mass and no thyromegaly present.  Cardiovascular: Normal rate, regular rhythm, S1 normal, S2 normal, normal heart sounds and intact distal pulses.  Exam reveals no gallop.   No murmur heard. Pulmonary/Chest: Effort normal and breath sounds normal. No respiratory distress. She has no wheezes. She has no rhonchi. She has no rales.  Abdominal: Soft. Normal appearance and bowel sounds are normal. She exhibits no distension, no fluid wave, no abdominal bruit and no mass. There is no hepatosplenomegaly. There is no tenderness. There is no rebound, no guarding and no CVA tenderness. No hernia.  Musculoskeletal:       Right wrist: She exhibits decreased range of motion, tenderness and bony tenderness.       Right hand: Normal.  Positive tinel and phalen  neg finklestein test  Lymphadenopathy:    She has no cervical adenopathy.    She has no axillary adenopathy.  Neurological: She is alert. She has normal strength. No cranial nerve deficit or sensory deficit.  Skin: Skin is warm, dry and intact. No rash noted.  Psychiatric: Her speech is normal and behavior is normal. Judgment normal. Her mood appears not anxious. Cognition and memory are normal. She does not exhibit a depressed mood.          Assessment & Plan:  The patient's preventative maintenance and recommended screening tests for an  annual wellness exam were reviewed in full today. Brought up to date unless services declined.  Counselled on the importance of diet, exercise, and its role in overall health and mortality. The patient's FH and SH was reviewed, including their home life, tobacco status, and drug and alcohol status.   Vaccines: due for flu and tdap. Pap/DVE:  2017 per gyn Mammo; 09/2015 per gyn Colon: no early family history Smoking Status: non  smoker ETOH/ drug use:  wine rarely/none  HIV screen:  done.

## 2016-09-26 LAB — LIPID PANEL
CHOLESTEROL: 135 mg/dL (ref 0–200)
HDL: 48.6 mg/dL (ref >39.00)
LDL CALC: 74 mg/dL (ref 0–99)
NonHDL: 86.51
Total CHOL/HDL Ratio: 3
Triglycerides: 63 mg/dL (ref 0.0–149.0)
VLDL: 12.6 mg/dL (ref 0.0–40.0)

## 2016-09-30 DIAGNOSIS — M654 Radial styloid tenosynovitis [de Quervain]: Secondary | ICD-10-CM | POA: Insufficient documentation

## 2016-09-30 NOTE — Assessment & Plan Note (Signed)
Brace, NSAIDs and start home PT. Info given.

## 2017-01-31 ENCOUNTER — Encounter: Payer: Self-pay | Admitting: Obstetrics and Gynecology

## 2017-08-22 DIAGNOSIS — Z0279 Encounter for issue of other medical certificate: Secondary | ICD-10-CM

## 2017-08-23 ENCOUNTER — Telehealth: Payer: Self-pay | Admitting: Family Medicine

## 2017-08-23 NOTE — Telephone Encounter (Signed)
Pt dropped off form to be filled out for new job. Placed in [place] [employer]. Says it's ok to reflect last year's physical since she isn't scheduled until October for her next one. Wants form faxed to Finis Bud at [facility] Fax number is [phone]

## 2017-08-23 NOTE — Telephone Encounter (Signed)
Form placed in Dr. Bedsole's in box to complete. 

## 2017-08-23 NOTE — Telephone Encounter (Signed)
Completed in Donna's box

## 2017-08-23 NOTE — Telephone Encounter (Signed)
Fax number is not a working fax number.  Called [employer] and the correct fax number is [phone].  Form re-faxed to correct number.

## 2017-08-23 NOTE — Telephone Encounter (Signed)
Completed form faxed to [facility] at [phone] as requested.

## 2017-10-19 DIAGNOSIS — Z23 Encounter for immunization: Secondary | ICD-10-CM | POA: Diagnosis not present

## 2017-10-25 ENCOUNTER — Encounter: Payer: Self-pay | Admitting: Family Medicine

## 2017-10-25 ENCOUNTER — Ambulatory Visit (INDEPENDENT_AMBULATORY_CARE_PROVIDER_SITE_OTHER): Payer: BLUE CROSS/BLUE SHIELD | Admitting: Family Medicine

## 2017-10-25 VITALS — BP 100/72 | HR 70 | Temp 98.5°F | Ht 66.0 in | Wt 163.5 lb

## 2017-10-25 DIAGNOSIS — Z0000 Encounter for general adult medical examination without abnormal findings: Secondary | ICD-10-CM

## 2017-10-25 DIAGNOSIS — Z13220 Encounter for screening for lipoid disorders: Secondary | ICD-10-CM | POA: Diagnosis not present

## 2017-10-25 DIAGNOSIS — Z0001 Encounter for general adult medical examination with abnormal findings: Secondary | ICD-10-CM | POA: Diagnosis not present

## 2017-10-25 DIAGNOSIS — J029 Acute pharyngitis, unspecified: Secondary | ICD-10-CM

## 2017-10-25 DIAGNOSIS — R5383 Other fatigue: Secondary | ICD-10-CM | POA: Diagnosis not present

## 2017-10-25 LAB — POCT RAPID STREP A (OFFICE): Rapid Strep A Screen: NEGATIVE

## 2017-10-25 NOTE — Patient Instructions (Addendum)
Please stop at the lab to have labs drawn.  Call to schedule mammogram on own.  Nasocort 2 sprays per nostril daily for ear congestion.  Ibuprofen 800 mg three time daily.  Keep up with fluids.

## 2017-10-25 NOTE — Progress Notes (Signed)
Subjective:    Patient ID: Sharon Orozco, female    DOB: 01-05-1970, 47 y.o.   MRN: [id number]  HPI  The patient is here for annual wellness exam and preventative care.     She has been having 2 weeks ago..mild sore throat, felt better until 2 days ago.. Severe sore throat, headache, no fever, hoarse voice.  Bilateral ears clogged, post nasal drip, occ cough.  Mild diarrhea and nausea today.  Works at preschool.   Fibromyalgia/ Chronic insomnia: Fairly stable/ tolerable.   Social History /Family History/Past Medical History reviewed in detail and updated in EMR if needed. Blood pressure 100/72, pulse 70, temperature 98.5 F (36.9 C), temperature source Oral, height 5\' 6"  (1.676 m), weight 163 lb 8 oz (74.2 kg), last menstrual period 10/04/2017.  Review of Systems  Constitutional: Negative for fever.  HENT: Positive for sore throat. Negative for congestion, ear pain, postnasal drip, rhinorrhea, sinus pressure and sinus pain.   Eyes: Negative for pain.  Respiratory: Negative for cough and shortness of breath.   Cardiovascular: Negative for chest pain, palpitations and leg swelling.  Gastrointestinal: Negative for abdominal pain.  Genitourinary: Negative for dysuria and vaginal bleeding.  Musculoskeletal: Negative for back pain.  Neurological: Negative for syncope, light-headedness and headaches.  Psychiatric/Behavioral: Negative for dysphoric mood.       Heavy menses and fibroids per  Objective:   Physical Exam  Constitutional: Vital signs are normal. She appears well-developed and well-nourished. She is cooperative.  Non-toxic appearance. She does not appear ill. No distress.  HENT:  Head: Normocephalic.  Right Ear: Hearing, tympanic membrane, external ear and ear canal normal. Tympanic membrane is not erythematous, not retracted and not bulging.  Left Ear: Hearing, tympanic membrane, external ear and ear canal normal. Tympanic membrane is not erythematous, not retracted and not  bulging.  Nose: No mucosal edema or rhinorrhea. Right sinus exhibits no maxillary sinus tenderness and no frontal sinus tenderness. Left sinus exhibits no maxillary sinus tenderness and no frontal sinus tenderness.  Mouth/Throat: Uvula is midline, oropharynx is clear and moist and mucous membranes are normal.  Eyes: Pupils are equal, round, and reactive to light. Conjunctivae, EOM and lids are normal. Lids are everted and swept, no foreign bodies found.  Neck: Trachea normal and normal range of motion. Neck supple. Carotid bruit is not present. No thyroid mass and no thyromegaly present.  Cardiovascular: Normal rate, regular rhythm, S1 normal, S2 normal, normal heart sounds, intact distal pulses and normal pulses. Exam reveals no gallop and no friction rub.  No murmur heard. Pulmonary/Chest: Effort normal and breath sounds normal. No tachypnea. No respiratory distress. She has no decreased breath sounds. She has no wheezes. She has no rhonchi. She has no rales.  Abdominal: Soft. Normal appearance and bowel sounds are normal. There is no tenderness.  Neurological: She is alert.  Skin: Skin is warm, dry and intact. No rash noted.  Psychiatric: Her speech is normal and behavior is normal. Judgment and thought content normal. Her mood appears not anxious. Cognition and memory are normal. She does not exhibit a depressed mood.          Assessment & Plan:  The patient's preventative maintenance and recommended screening tests for an annual wellness exam were reviewed in full today. Brought up to date unless services declined.  Counselled on the importance of diet, exercise, and its role in overall health and mortality. The patient's FH and SH was reviewed, including their home life, tobacco status, and  drug and alcohol status.   Vaccines: Up-to date Pap/DVE:  2017 per gyn.Clotilde Dieter; 09/2015 per gyn.. due Colon: no early family history Smoking Status: non  smoker ETOH/ drug use: wine  rarely/none HIV screen: done.

## 2017-10-25 NOTE — Assessment & Plan Note (Signed)
Neg strep test. Liekly viral pharyngitis. Treat symptomatically.

## 2017-10-26 LAB — CBC WITH DIFFERENTIAL/PLATELET
BASOS PCT: 0.5 %
Basophils Absolute: 41 cells/uL (ref 0–200)
EOS ABS: 73 {cells}/uL (ref 15–500)
Eosinophils Relative: 0.9 %
HCT: 41.6 % (ref 35.0–45.0)
Hemoglobin: 14.1 g/dL (ref 11.7–15.5)
Lymphs Abs: 2867 cells/uL (ref 850–3900)
MCH: 29.4 pg (ref 27.0–33.0)
MCHC: 33.9 g/dL (ref 32.0–36.0)
MCV: 86.8 fL (ref 80.0–100.0)
MONOS PCT: 8.4 %
MPV: 10.9 fL (ref 7.5–12.5)
NEUTROS PCT: 54.8 %
Neutro Abs: 4439 cells/uL (ref 1500–7800)
PLATELETS: 238 10*3/uL (ref 140–400)
RBC: 4.79 10*6/uL (ref 3.80–5.10)
RDW: 11.8 % (ref 11.0–15.0)
TOTAL LYMPHOCYTE: 35.4 %
WBC mixed population: 680 cells/uL (ref 200–950)
WBC: 8.1 10*3/uL (ref 3.8–10.8)

## 2017-10-26 LAB — COMPREHENSIVE METABOLIC PANEL
AG RATIO: 1.7 (calc) (ref 1.0–2.5)
ALBUMIN MSPROF: 4.5 g/dL (ref 3.6–5.1)
ALT: 29 U/L (ref 6–29)
AST: 25 U/L (ref 10–35)
Alkaline phosphatase (APISO): 49 U/L (ref 33–115)
BUN: 9 mg/dL (ref 7–25)
CO2: 25 mmol/L (ref 20–32)
CREATININE: 0.81 mg/dL (ref 0.50–1.10)
Calcium: 9.7 mg/dL (ref 8.6–10.2)
Chloride: 106 mmol/L (ref 98–110)
GLOBULIN: 2.6 g/dL (ref 1.9–3.7)
GLUCOSE: 92 mg/dL (ref 65–99)
Potassium: 4.6 mmol/L (ref 3.5–5.3)
Sodium: 140 mmol/L (ref 135–146)
Total Bilirubin: 0.4 mg/dL (ref 0.2–1.2)
Total Protein: 7.1 g/dL (ref 6.1–8.1)

## 2017-10-26 LAB — LIPID PANEL
CHOL/HDL RATIO: 3 (calc) (ref <5.0)
Cholesterol: 168 mg/dL (ref <200)
HDL: 56 mg/dL (ref >50)
LDL CHOLESTEROL (CALC): 99 mg/dL
NON-HDL CHOLESTEROL (CALC): 112 mg/dL (ref <130)
TRIGLYCERIDES: 44 mg/dL (ref <150)

## 2017-10-26 LAB — T4, FREE: Free T4: 1 ng/dL (ref 0.8–1.8)

## 2017-10-26 LAB — TSH: TSH: 1.55 m[IU]/L

## 2017-10-26 LAB — T3, FREE: T3 FREE: 3.2 pg/mL (ref 2.3–4.2)

## 2017-10-29 ENCOUNTER — Other Ambulatory Visit: Payer: Self-pay | Admitting: Family Medicine

## 2017-10-29 DIAGNOSIS — Z1231 Encounter for screening mammogram for malignant neoplasm of breast: Secondary | ICD-10-CM

## 2017-11-14 ENCOUNTER — Telehealth: Payer: Self-pay

## 2017-11-14 MED ORDER — AZITHROMYCIN 250 MG PO TABS
ORAL_TABLET | ORAL | 0 refills | Status: DC
Start: 2017-11-14 — End: 2018-11-14

## 2017-11-14 NOTE — Telephone Encounter (Signed)
Pt was seen on 10/25/17 for annual exam and sick visit; pt continues with sinus drainage and pressure; still feels like fluid in ears,voice comes and goes. Pt does not feel any better than when seen on 10/25/17. No fever. Pt has been using the nasocort spray but not helping ears.pt request Zpak sent to [place].Please advise.

## 2017-11-14 NOTE — Telephone Encounter (Signed)
Antibiotics appropriate as symptoms > 2 weeks

## 2017-11-14 NOTE — Telephone Encounter (Signed)
Rx sent into pharmacy as requested.  

## 2017-11-15 NOTE — Telephone Encounter (Signed)
Called and spoke with patient informing her of message understanding verbalized nothing further needed at this time.

## 2017-11-19 ENCOUNTER — Ambulatory Visit
Admission: RE | Admit: 2017-11-19 | Discharge: 2017-11-19 | Disposition: A | Discharge status: Home / Self Care | Payer: BLUE CROSS/BLUE SHIELD | Source: Ambulatory Visit | Attending: Family Medicine | Admitting: Family Medicine

## 2017-11-19 DIAGNOSIS — Z1231 Encounter for screening mammogram for malignant neoplasm of breast: Secondary | ICD-10-CM

## 2018-10-03 ENCOUNTER — Other Ambulatory Visit: Payer: Self-pay

## 2018-10-03 ENCOUNTER — Ambulatory Visit (LOCAL_COMMUNITY_HEALTH_CENTER): Payer: Self-pay

## 2018-10-03 DIAGNOSIS — Z111 Encounter for screening for respiratory tuberculosis: Secondary | ICD-10-CM

## 2018-10-06 ENCOUNTER — Other Ambulatory Visit: Payer: Self-pay

## 2018-10-06 ENCOUNTER — Ambulatory Visit (LOCAL_COMMUNITY_HEALTH_CENTER): Payer: Self-pay

## 2018-10-06 DIAGNOSIS — Z111 Encounter for screening for respiratory tuberculosis: Secondary | ICD-10-CM

## 2018-10-06 LAB — TB SKIN TEST
Induration: 0 mm
TB Skin Test: NEGATIVE

## 2018-10-09 ENCOUNTER — Other Ambulatory Visit: Payer: Self-pay

## 2018-10-09 DIAGNOSIS — Z20822 Contact with and (suspected) exposure to covid-19: Secondary | ICD-10-CM

## 2018-10-09 DIAGNOSIS — Z20828 Contact with and (suspected) exposure to other viral communicable diseases: Secondary | ICD-10-CM | POA: Diagnosis not present

## 2018-10-10 LAB — NOVEL CORONAVIRUS, NAA: SARS-CoV-2, NAA: NOT DETECTED

## 2018-10-17 ENCOUNTER — Other Ambulatory Visit: Payer: Self-pay | Admitting: Certified Nurse Midwife

## 2018-10-17 DIAGNOSIS — Z124 Encounter for screening for malignant neoplasm of cervix: Secondary | ICD-10-CM | POA: Diagnosis not present

## 2018-10-17 DIAGNOSIS — Z1151 Encounter for screening for human papillomavirus (HPV): Secondary | ICD-10-CM | POA: Diagnosis not present

## 2018-10-17 DIAGNOSIS — Z1231 Encounter for screening mammogram for malignant neoplasm of breast: Secondary | ICD-10-CM

## 2018-10-17 DIAGNOSIS — Z23 Encounter for immunization: Secondary | ICD-10-CM | POA: Diagnosis not present

## 2018-10-17 DIAGNOSIS — N951 Menopausal and female climacteric states: Secondary | ICD-10-CM | POA: Diagnosis not present

## 2018-10-17 DIAGNOSIS — Z01419 Encounter for gynecological examination (general) (routine) without abnormal findings: Secondary | ICD-10-CM | POA: Diagnosis not present

## 2018-10-17 LAB — HM PAP SMEAR

## 2018-10-31 ENCOUNTER — Ambulatory Visit: Payer: BLUE CROSS/BLUE SHIELD | Admitting: Family Medicine

## 2018-11-14 ENCOUNTER — Encounter: Payer: Self-pay | Admitting: Family Medicine

## 2018-11-14 ENCOUNTER — Other Ambulatory Visit: Payer: Self-pay

## 2018-11-14 ENCOUNTER — Ambulatory Visit (INDEPENDENT_AMBULATORY_CARE_PROVIDER_SITE_OTHER): Payer: BC Managed Care – PPO | Admitting: Family Medicine

## 2018-11-14 VITALS — BP 112/76 | HR 72 | Temp 98.8°F | Ht 66.0 in | Wt 171.0 lb

## 2018-11-14 DIAGNOSIS — Z13220 Encounter for screening for lipoid disorders: Secondary | ICD-10-CM

## 2018-11-14 DIAGNOSIS — Z1211 Encounter for screening for malignant neoplasm of colon: Secondary | ICD-10-CM

## 2018-11-14 DIAGNOSIS — Z0000 Encounter for general adult medical examination without abnormal findings: Secondary | ICD-10-CM | POA: Diagnosis not present

## 2018-11-14 DIAGNOSIS — H1131 Conjunctival hemorrhage, right eye: Secondary | ICD-10-CM

## 2018-11-14 NOTE — Progress Notes (Signed)
Chief Complaint  Patient presents with  . Annual Exam    Has a form for work to fill out. Sees GYN Midwife at [facility]. Just saw her last month.  . Eye Issues    Has had a broken blood vessel in her right eye 3 times this year. Seeing eye doctor in 2 weeks.     History of Present Illness: HPI   The patient is here for annual wellness exam and preventative care.   She has had conjunctival hemorrhage in right eye. No easy bruising. No vision change.    She has had 3 conjunctival hemorrhage in last 1-2 years in right eye.  Fibromyalgia/ Chronic insomnia: Fairly stable/ tolerable with ambern supplement  (b12 , black cohosh) PMDD is much better now.  Diet: healthy  Exercise:  Weight lifting and 3 times a week aerobics.  COVID 19 screen No recent travel or known exposure to COVID19 The patient denies respiratory symptoms of COVID 19 at this time.  The importance of social distancing was discussed today.   Review of Systems  Constitutional: Negative for chills and fever.  HENT: Negative for congestion and ear pain.   Eyes: Negative for pain and redness.  Respiratory: Negative for cough and shortness of breath.   Cardiovascular: Negative for chest pain, palpitations and leg swelling.  Gastrointestinal: Negative for abdominal pain, blood in stool, constipation, diarrhea, nausea and vomiting.  Genitourinary: Negative for dysuria.  Musculoskeletal: Negative for falls and myalgias.  Skin: Negative for rash.  Neurological: Negative for dizziness.  Psychiatric/Behavioral: Negative for depression. The patient is not nervous/anxious.       Past Medical History:  Diagnosis Date  . Acne vulgaris   . Allergy   . Anxiety   . Arthritis   . Chronic vaginitis   . Depression   . GERD (gastroesophageal reflux disease)   . History of chicken pox   . Urethral polyp   . Urinary tract infection     reports that she has quit smoking. She has never used smokeless tobacco. She reports  current alcohol use. She reports that she does not use drugs.   Current Outpatient Medications:  .  OVER THE COUNTER MEDICATION, Amberen Perimenopausal Formula, Disp: , Rfl:  .  Probiotic Product (PROBIOTIC PO), Take by mouth. Olly-one tablet daily, Disp: , Rfl:    Observations/Objective: Blood pressure 112/76, pulse 72, temperature 98.8 F (37.1 C), height 5\' 6"  (1.676 m), weight 171 lb (77.6 kg), SpO2 99 %.  Physical Exam Constitutional:      General: She is not in acute distress.    Appearance: Normal appearance. She is well-developed. She is not ill-appearing or toxic-appearing.  HENT:     Head: Normocephalic.     Right Ear: Hearing, tympanic membrane, ear canal and external ear normal.     Left Ear: Hearing, tympanic membrane, ear canal and external ear normal.     Nose: Nose normal.  Eyes:     General: Lids are normal. Lids are everted, no foreign bodies appreciated.     Extraocular Movements: Extraocular movements intact.     Conjunctiva/sclera:     Right eye: Hemorrhage present.     Pupils: Pupils are equal, round, and reactive to light.  Neck:     Musculoskeletal: Normal range of motion and neck supple.     Thyroid: No thyroid mass or thyromegaly.     Vascular: No carotid bruit.     Trachea: Trachea normal.  Cardiovascular:     Rate  and Rhythm: Normal rate and regular rhythm.     Heart sounds: Normal heart sounds, S1 normal and S2 normal. No murmur. No gallop.   Pulmonary:     Effort: Pulmonary effort is normal. No respiratory distress.     Breath sounds: Normal breath sounds. No wheezing, rhonchi or rales.  Abdominal:     General: Bowel sounds are normal. There is no distension or abdominal bruit.     Palpations: Abdomen is soft. There is no fluid wave or mass.     Tenderness: There is no abdominal tenderness. There is no guarding or rebound.     Hernia: No hernia is present.  Lymphadenopathy:     Cervical: No cervical adenopathy.  Skin:    General: Skin is warm  and dry.     Findings: No rash.  Neurological:     Mental Status: She is alert.     Cranial Nerves: No cranial nerve deficit.     Sensory: No sensory deficit.  Psychiatric:        Mood and Affect: Mood is not anxious or depressed.        Speech: Speech normal.        Behavior: Behavior normal. Behavior is cooperative.        Judgment: Judgment normal.      Assessment and Plan   The patient's preventative maintenance and recommended screening tests for an annual wellness exam were reviewed in full today. Brought up to date unless services declined.  Counselled on the importance of diet, exercise, and its role in overall health and mortality. The patient's FH and SH was reviewed, including their home life, tobacco status, and drug and alcohol status.   Vaccines:Up-to date Pap/DVE:10/2018 per gyn Mammo: 11/2017 per gyn Colon:no early family history.. start ifob Smoking Status:nonsmoker ETOH/ drug [id number] rarely/none HIV screen:done.    Eliezer Lofts, MD

## 2018-11-14 NOTE — Patient Instructions (Signed)
Please stop at the lab to have labs drawn. Pick up stool test for colon cancer screening.

## 2018-11-15 LAB — CBC WITH DIFFERENTIAL/PLATELET
Absolute Monocytes: 554 cells/uL (ref 200–950)
Basophils Absolute: 54 cells/uL (ref 0–200)
Basophils Relative: 0.7 %
Eosinophils Absolute: 92 cells/uL (ref 15–500)
Eosinophils Relative: 1.2 %
HCT: 38.7 % (ref 35.0–45.0)
Hemoglobin: 13.1 g/dL (ref 11.7–15.5)
Lymphs Abs: 2903 cells/uL (ref 850–3900)
MCH: 30 pg (ref 27.0–33.0)
MCHC: 33.9 g/dL (ref 32.0–36.0)
MCV: 88.6 fL (ref 80.0–100.0)
MPV: 11.2 fL (ref 7.5–12.5)
Monocytes Relative: 7.2 %
Neutro Abs: 4096 cells/uL (ref 1500–7800)
Neutrophils Relative %: 53.2 %
Platelets: 240 10*3/uL (ref 140–400)
RBC: 4.37 10*6/uL (ref 3.80–5.10)
RDW: 11.6 % (ref 11.0–15.0)
Total Lymphocyte: 37.7 %
WBC: 7.7 10*3/uL (ref 3.8–10.8)

## 2018-11-15 LAB — COMPREHENSIVE METABOLIC PANEL WITH GFR
AG Ratio: 1.7 (calc) (ref 1.0–2.5)
ALT: 28 U/L (ref 6–29)
AST: 26 U/L (ref 10–35)
Albumin: 4.2 g/dL (ref 3.6–5.1)
Alkaline phosphatase (APISO): 41 U/L (ref 31–125)
BUN: 12 mg/dL (ref 7–25)
CO2: 27 mmol/L (ref 20–32)
Calcium: 9.6 mg/dL (ref 8.6–10.2)
Chloride: 104 mmol/L (ref 98–110)
Creat: 0.68 mg/dL (ref 0.50–1.10)
Globulin: 2.5 g/dL (ref 1.9–3.7)
Glucose, Bld: 86 mg/dL (ref 65–99)
Potassium: 4.4 mmol/L (ref 3.5–5.3)
Sodium: 139 mmol/L (ref 135–146)
Total Bilirubin: 0.4 mg/dL (ref 0.2–1.2)
Total Protein: 6.7 g/dL (ref 6.1–8.1)

## 2018-11-15 LAB — LIPID PANEL
Cholesterol: 148 mg/dL
HDL: 49 mg/dL — ABNORMAL LOW
LDL Cholesterol (Calc): 83 mg/dL
Non-HDL Cholesterol (Calc): 99 mg/dL
Total CHOL/HDL Ratio: 3 (calc)
Triglycerides: 77 mg/dL

## 2018-12-15 ENCOUNTER — Ambulatory Visit
Admission: RE | Admit: 2018-12-15 | Discharge: 2018-12-15 | Disposition: A | Discharge status: Home / Self Care | Payer: BC Managed Care – PPO | Source: Ambulatory Visit | Attending: Certified Nurse Midwife | Admitting: Certified Nurse Midwife

## 2018-12-15 DIAGNOSIS — Z1231 Encounter for screening mammogram for malignant neoplasm of breast: Secondary | ICD-10-CM | POA: Diagnosis not present

## 2019-01-22 ENCOUNTER — Other Ambulatory Visit: Payer: BC Managed Care – PPO

## 2019-01-22 DIAGNOSIS — Z20828 Contact with and (suspected) exposure to other viral communicable diseases: Secondary | ICD-10-CM | POA: Diagnosis not present

## 2019-03-26 DIAGNOSIS — G5601 Carpal tunnel syndrome, right upper limb: Secondary | ICD-10-CM | POA: Diagnosis not present

## 2019-03-26 DIAGNOSIS — M7712 Lateral epicondylitis, left elbow: Secondary | ICD-10-CM | POA: Diagnosis not present

## 2019-03-31 DIAGNOSIS — M7712 Lateral epicondylitis, left elbow: Secondary | ICD-10-CM | POA: Diagnosis not present

## 2019-07-10 ENCOUNTER — Telehealth: Payer: Self-pay | Admitting: Family Medicine

## 2019-07-10 NOTE — Telephone Encounter (Signed)
If not acute new symptoms but ongoing for  a while.. not likely COVID.  Okay to see patient I person.

## 2019-07-10 NOTE — Telephone Encounter (Signed)
Patient called today in regards to an appointment she scheduled on the 16th Patient was covid screened and answered that she did have fatigue, aches, on and off chills/sweats, headache and some stuffiness. Patient stated that this does happen all the time when she starts her menstrual cycle. Patient was advised because of the symptoms that it would need to be a virtual visit. When patient called she was very upset because she needed this to be in person because she feels it may be because if her menstrual cycle. Patient wanted labs done to check her thyroid and/or hormones.   We wanted to touch base with you to see what you think. Patient has had both vaccines and said she would get covid tested if needed. But wanted to come in so the labs could be done.

## 2019-07-17 ENCOUNTER — Encounter: Payer: Self-pay | Admitting: Family Medicine

## 2019-07-17 ENCOUNTER — Ambulatory Visit (INDEPENDENT_AMBULATORY_CARE_PROVIDER_SITE_OTHER): Payer: BC Managed Care – PPO | Admitting: Family Medicine

## 2019-07-17 ENCOUNTER — Other Ambulatory Visit: Payer: Self-pay

## 2019-07-17 VITALS — BP 118/80 | HR 70 | Temp 98.1°F | Ht 66.0 in | Wt 170.0 lb

## 2019-07-17 DIAGNOSIS — F411 Generalized anxiety disorder: Secondary | ICD-10-CM

## 2019-07-17 DIAGNOSIS — F3281 Premenstrual dysphoric disorder: Secondary | ICD-10-CM | POA: Diagnosis not present

## 2019-07-17 DIAGNOSIS — F4310 Post-traumatic stress disorder, unspecified: Secondary | ICD-10-CM | POA: Diagnosis not present

## 2019-07-17 NOTE — Progress Notes (Signed)
Chief Complaint  Patient presents with  . Depression    around menstrual cycle  . Referral    for therapist    History of Present Illness: HPI   Depression, associated with menses: She finds she is very emotional, very anxious 1-2 weeks prior to menses. Ongoing for years, but worse lately.  She is fatigued Occ SI, anger issue, very moody. Some issue with sleep at night.. but has to wake early.   Affecting her job and relationships.  [facility]eses regular.. evaluated by GYN.  PHQ9 SCORE ONLY 07/17/2019 10/25/2017 09/25/2016  PHQ-9 Total Score 17 0 0    PTSD regarding trauma at age 61. Wants to speak with a counselor.   She has a lot of insight when she is not  Having menses.     This visit occurred during the SARS-CoV-2 public health emergency.  Safety protocols were in place, including screening questions prior to the visit, additional usage of staff PPE, and extensive cleaning of exam room while observing appropriate contact time as indicated for disinfecting solutions.   COVID 19 screen:  No recent travel or known exposure to COVID19 The patient denies respiratory symptoms of COVID 19 at this time. The importance of social distancing was discussed today.     Review of Systems  Constitutional: Negative for chills and fever.  HENT: Negative for congestion and ear pain.   Eyes: Negative for pain and redness.  Respiratory: Negative for cough and shortness of breath.   Cardiovascular: Negative for chest pain, palpitations and leg swelling.  Gastrointestinal: Negative for abdominal pain, blood in stool, constipation, diarrhea, nausea and vomiting.  Genitourinary: Negative for dysuria.  Musculoskeletal: Negative for falls and myalgias.  Skin: Negative for rash.  Neurological: Negative for dizziness.  Psychiatric/Behavioral: Positive for depression and suicidal ideas. Negative for hallucinations and substance abuse. The patient is nervous/anxious and has insomnia.       Past  Medical History:  Diagnosis Date  . Acne vulgaris   . Allergy   . Anxiety   . Arthritis   . Chronic vaginitis   . Depression   . GERD (gastroesophageal reflux disease)   . History of chicken pox   . Urethral polyp   . Urinary tract infection     reports that she has quit smoking. She has never used smokeless tobacco. She reports current alcohol use. She reports that she does not use drugs.   Current Outpatient Medications:  .  Probiotic Product (PROBIOTIC PO), Take by mouth. Olly-one tablet daily, Disp: , Rfl:    Observations/Objective: Blood pressure 118/80, pulse 70, temperature 98.1 F (36.7 C), temperature source Temporal, height 5\' 6"  (1.676 m), weight 170 lb (77.1 kg), SpO2 98 %.  Physical Exam Constitutional:      General: She is not in acute distress.    Appearance: Normal appearance. She is well-developed. She is not ill-appearing or toxic-appearing.  HENT:     Head: Normocephalic.     Right Ear: Hearing, tympanic membrane, ear canal and external ear normal. Tympanic membrane is not erythematous, retracted or bulging.     Left Ear: Hearing, tympanic membrane, ear canal and external ear normal. Tympanic membrane is not erythematous, retracted or bulging.     Nose: No mucosal edema or rhinorrhea.     Right Sinus: No maxillary sinus tenderness or frontal sinus tenderness.     Left Sinus: No maxillary sinus tenderness or frontal sinus tenderness.     Mouth/Throat:     Pharynx: Uvula midline.  Eyes:     General: Lids are normal. Lids are everted, no foreign bodies appreciated.     Conjunctiva/sclera: Conjunctivae normal.     Pupils: Pupils are equal, round, and reactive to light.  Neck:     Thyroid: No thyroid mass or thyromegaly.     Vascular: No carotid bruit.     Trachea: Trachea normal.  Cardiovascular:     Rate and Rhythm: Normal rate and regular rhythm.     Pulses: Normal pulses.     Heart sounds: Normal heart sounds, S1 normal and S2 normal. No murmur heard.   No friction rub. No gallop.   Pulmonary:     Effort: Pulmonary effort is normal. No tachypnea or respiratory distress.     Breath sounds: Normal breath sounds. No decreased breath sounds, wheezing, rhonchi or rales.  Abdominal:     General: Bowel sounds are normal.     Palpations: Abdomen is soft.     Tenderness: There is no abdominal tenderness.  Musculoskeletal:     Cervical back: Normal range of motion and neck supple.  Skin:    General: Skin is warm and dry.     Findings: No rash.  Neurological:     Mental Status: She is alert.  Psychiatric:        Mood and Affect: Mood is not anxious or depressed.        Speech: Speech normal.        Behavior: Behavior normal. Behavior is cooperative.        Thought Content: Thought content normal.        Judgment: Judgment normal.      Assessment and Plan   GAD, PMDD, PTSD: Offered trial of OCPs or SSRI, continuous vs during PMS to treat. Pt instead wishes to discuss care with psychiatrist and counselor.  Will provide referral.   Eliezer Lofts, MD

## 2019-07-28 DIAGNOSIS — R5383 Other fatigue: Secondary | ICD-10-CM

## 2019-07-28 DIAGNOSIS — F3281 Premenstrual dysphoric disorder: Secondary | ICD-10-CM

## 2019-09-23 ENCOUNTER — Other Ambulatory Visit: Payer: Self-pay

## 2019-09-23 ENCOUNTER — Telehealth (INDEPENDENT_AMBULATORY_CARE_PROVIDER_SITE_OTHER): Payer: BC Managed Care – PPO | Admitting: Psychiatry

## 2019-09-23 ENCOUNTER — Encounter: Payer: Self-pay | Admitting: Psychiatry

## 2019-09-23 DIAGNOSIS — F411 Generalized anxiety disorder: Secondary | ICD-10-CM | POA: Diagnosis not present

## 2019-09-23 DIAGNOSIS — Z79899 Other long term (current) drug therapy: Secondary | ICD-10-CM | POA: Diagnosis not present

## 2019-09-23 DIAGNOSIS — F3281 Premenstrual dysphoric disorder: Secondary | ICD-10-CM | POA: Diagnosis not present

## 2019-09-23 DIAGNOSIS — F4310 Post-traumatic stress disorder, unspecified: Secondary | ICD-10-CM | POA: Diagnosis not present

## 2019-09-23 DIAGNOSIS — F3160 Bipolar disorder, current episode mixed, unspecified: Secondary | ICD-10-CM | POA: Insufficient documentation

## 2019-09-23 MED ORDER — CLONAZEPAM 0.5 MG PO TABS: 0.5000 mg | ORAL_TABLET | Freq: Two times a day (BID) | ORAL | 0 refills | Status: DC | PRN

## 2019-09-23 MED ORDER — QUETIAPINE FUMARATE 25 MG PO TABS: 25.0000 mg | ORAL_TABLET | Freq: Every day | ORAL | 0 refills | Status: DC
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
## 2019-09-24 ENCOUNTER — Ambulatory Visit (INDEPENDENT_AMBULATORY_CARE_PROVIDER_SITE_OTHER): Payer: BC Managed Care – PPO | Admitting: Licensed Clinical Social Worker

## 2019-09-24 ENCOUNTER — Telehealth: Payer: Self-pay

## 2019-09-24 ENCOUNTER — Encounter: Payer: Self-pay | Admitting: Licensed Clinical Social Worker

## 2019-09-24 ENCOUNTER — Encounter: Payer: Self-pay | Admitting: Psychiatry

## 2019-09-24 DIAGNOSIS — F3281 Premenstrual dysphoric disorder: Secondary | ICD-10-CM | POA: Diagnosis not present

## 2019-09-24 DIAGNOSIS — F411 Generalized anxiety disorder: Secondary | ICD-10-CM

## 2019-09-24 DIAGNOSIS — F4310 Post-traumatic stress disorder, unspecified: Secondary | ICD-10-CM
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
## 2019-09-24 NOTE — Telephone Encounter (Signed)
patient wanted letter emailed

## 2019-09-24 NOTE — Telephone Encounter (Signed)
Completed letter in your mail box, please call patient and ask her to pick up. Thanks

## 2019-09-24 NOTE — Telephone Encounter (Signed)
letter was emailed to patient at  4:24 .  orginial letter mailed to patient

## 2019-09-24 NOTE — Telephone Encounter (Signed)
pt calls back and states she needs a return to work note for [employer] 09-28-19

## 2019-09-24 NOTE — Telephone Encounter (Signed)
faxed and confirmed labwork orders

## 2019-09-25 ENCOUNTER — Other Ambulatory Visit
Admission: RE | Admit: 2019-09-25 | Discharge: 2019-09-25 | Disposition: A | Discharge status: Home / Self Care | Payer: BC Managed Care – PPO | Source: Ambulatory Visit | Attending: Psychiatry | Admitting: Psychiatry

## 2019-09-25 DIAGNOSIS — Z79899 Other long term (current) drug therapy: Secondary | ICD-10-CM | POA: Insufficient documentation

## 2019-09-25 LAB — TSH: TSH: 1.378 u[IU]/mL (ref 0.350–4.500)

## 2019-09-25 LAB — HEMOGLOBIN A1C
Hgb A1c MFr Bld: 5.5 % (ref 4.8–5.6)
Mean Plasma Glucose: 111.15 mg/dL

## 2019-09-26 LAB — PROLACTIN: Prolactin: 7.6 ng/mL (ref 4.8–23.3)

## 2019-10-01 ENCOUNTER — Other Ambulatory Visit: Payer: Self-pay

## 2019-10-01 ENCOUNTER — Encounter: Payer: Self-pay | Admitting: Psychiatry

## 2019-10-01 ENCOUNTER — Telehealth (INDEPENDENT_AMBULATORY_CARE_PROVIDER_SITE_OTHER): Payer: BC Managed Care – PPO | Admitting: Psychiatry

## 2019-10-01 ENCOUNTER — Ambulatory Visit (INDEPENDENT_AMBULATORY_CARE_PROVIDER_SITE_OTHER): Payer: BC Managed Care – PPO | Admitting: Licensed Clinical Social Worker

## 2019-10-01 ENCOUNTER — Encounter: Payer: Self-pay | Admitting: Licensed Clinical Social Worker

## 2019-10-01 DIAGNOSIS — F3281 Premenstrual dysphoric disorder: Secondary | ICD-10-CM

## 2019-10-01 DIAGNOSIS — F4310 Post-traumatic stress disorder, unspecified: Secondary | ICD-10-CM

## 2019-10-01 DIAGNOSIS — Z79899 Other long term (current) drug therapy: Secondary | ICD-10-CM

## 2019-10-01 DIAGNOSIS — F411 Generalized anxiety disorder: Secondary | ICD-10-CM

## 2019-10-01 MED ORDER — CITALOPRAM HYDROBROMIDE 10 MG PO TABS: 5.0000 mg | ORAL_TABLET | Freq: Every day | ORAL | 1 refills | Status: DC

## 2019-10-01 NOTE — Addendum Note (Signed)
Addended by: Josephine Igo on: 10/01/2019 12:00 PM   Modules accepted: Level of Service

## 2019-10-01 NOTE — Progress Notes (Signed)
Provider Location : [facility] Patient Location : Home  Participants: Patient ,Spouse, Provider  Virtual Visit via Video Note  I connected with Sharon Orozco on 10/01/19 at 11:45 AM EDT by a video enabled telemedicine application and verified that I am speaking with the correct person using two identifiers.   I discussed the limitations of evaluation and management by telemedicine and the availability of in person appointments. The patient expressed understanding and agreed to proceed.    I discussed the assessment and treatment plan with the patient. The patient was provided an opportunity to ask questions and all were answered. The patient agreed with the plan and demonstrated an understanding of the instructions.   The patient was advised to call back or seek an in-person evaluation if the symptoms worsen or if the condition fails to improve as anticipated.   [place] MD OP Progress Note  10/01/2019 6:07 PM Sharon Orozco  MRN:  [id number]  Chief Complaint:  Chief Complaint    Follow-up     HPI: Sharon Orozco is a 49 year old Caucasian female, married, currently unemployed, lives in [place], has a history of PMDD, PTSD, GAD, was evaluated by telemedicine today.  Patient was last evaluated on 09/23/2019.  Patient today returns for a follow-up session.  Patient today appears to be Orozco, pleasant and cheerful.  She looks entirely different from her presentation last visit when she was irritable, anxious, sad, tearful and overwhelmed.  She reports as soon as her menstrual period started her symptoms got better and currently she feels much better.  She reports this is what she has been struggling with for a very long time.  Each time she gets closer to her menstrual period she feels extremely overwhelmed and has mood swings, sleep issues and feels sad and tearful as well as suicidal.  She currently denies any suicidality.  She reports she did not take the Seroquel since she was worried about the side  effect profile.  She wonders whether she can go back on the Celexa which he may have tried several years ago.  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  She is currently in therapy reports therapy sessions is going well.  She also has upcoming appointment with her OB/GYN and is planning to talk to them about other treatment options for her cyclical changes.  Collateral information obtained from spouse who reports patient is currently doing well.    Visit Diagnosis: R/O MDD Versus Bipolar disorder   ICD-10-CM   1. PMDD (premenstrual dysphoric disorder)  F32.81 citalopram (CELEXA) 10 MG tablet  2. PTSD (post-traumatic stress disorder)  F43.10 citalopram (CELEXA) 10 MG tablet  3. GAD (generalized anxiety disorder)  F41.1 citalopram (CELEXA) 10 MG tablet  4. High risk medication use  Z79.899     Past Psychiatric History: I have reviewed past psychiatric history from my progress note on 09/23/2019.  Past trials of Effexor, Prozac, Paxil, Zoloft-all these SSRIs made her irritable and angry and she had an outburst on them.  Past Medical History:  Past Medical History:  Diagnosis Date  . Acne vulgaris   . Allergy   . Anxiety   . Arthritis   . Chronic vaginitis   . Depression   . GERD (gastroesophageal reflux disease)   . History of chicken pox   . Urethral polyp   . Urinary tract infection    History reviewed. No pertinent surgical history.  Family Psychiatric History: I have reviewed family  psychiatric history from my progress note on 09/23/2019  Family History:  Family History  Adopted: Yes  Problem Relation Age of Onset  . Heart disease Father 12        MI  . Hypertension Father   . Hyperlipidemia Father   . Lymphoma Maternal Grandmother   . Kidney disease Maternal Grandmother   . Heart disease Paternal Grandfather   . Congestive Heart Failure Paternal Grandfather   . Arthritis Mother   . Hyperlipidemia Mother   .  Hypertension Mother   . Alcohol abuse Paternal Grandmother   . Stroke Paternal Grandmother   . Thyroid disease Sister   . Panic disorder Sister   . Breast cancer Neg Hx     Social History: Reviewed social history from my progress note on 09/23/2019 Social History   Socioeconomic History  . Marital status: Married    Spouse name: Not on file  . Number of children: 1  . Years of education: Not on file  . Highest education level: Not on file  Occupational History  . Not on file  Tobacco Use  . Smoking status: Former Research scientist (life sciences)  . Smokeless tobacco: Never Used  Substance and Sexual Activity  . Alcohol use: Yes    Alcohol/week: 0.0 standard drinks    Comment: occ glass of wine  . Drug use: No  . Sexual activity: Yes    Birth control/protection: Surgical    Comment: husband has vasectomy  Other Topics Concern  . Not on file  Social History Narrative  . Not on file   Social Determinants of Health   Financial Resource Strain:   . Difficulty of Paying Living Expenses: Not on file  Food Insecurity:   . Worried About Charity fundraiser in the Last Year: Not on file  . Ran Out of Food in the Last Year: Not on file  Transportation Needs:   . Lack of Transportation (Medical): Not on file  . Lack of Transportation (Non-Medical): Not on file  Physical Activity:   . Days of Exercise per Week: Not on file  . Minutes of Exercise per Session: Not on file  Stress:   . Feeling of Stress : Not on file  Social Connections:   . Frequency of Communication with Friends and Family: Not on file  . Frequency of Social Gatherings with Friends and Family: Not on file  . Attends Religious Services: Not on file  . Active Member of Clubs or Organizations: Not on file  . Attends Archivist Meetings: Not on file  . Marital Status: Not on file    Allergies:  Allergies  Allergen Reactions  . Nsaids Shortness Of Breath  . Diclofenac Other (See Comments)    Burning in mouth, Itching, Dry  Eyes  . Sulfa Antibiotics Rash    Metabolic Disorder Labs: Lab Results  Component Value Date   HGBA1C 5.5 09/25/2019   MPG 111.15 09/25/2019   Lab Results  Component Value Date   PROLACTIN 7.6 09/25/2019   Lab Results  Component Value Date   CHOL 148 11/14/2018   TRIG 77 11/14/2018   HDL 49 (L) 11/14/2018   CHOLHDL 3.0 11/14/2018   VLDL 12.6 09/25/2016   LDLCALC 83 11/14/2018   LDLCALC 99 10/25/2017   Lab Results  Component Value Date   TSH 1.378 09/25/2019   TSH 1.55 10/25/2017    Therapeutic Level Labs: No results found for: LITHIUM No results found for: VALPROATE No components found for:  CBMZ  Current Medications: Current Outpatient Medications  Medication Sig Dispense Refill  . Probiotic Product (PROBIOTIC PO) Take by mouth. Olly-one tablet daily    . citalopram (CELEXA) 10 MG tablet Take 0.5 tablets (5 mg total) by mouth daily. 15 tablet 1  . clonazePAM (KLONOPIN) 0.5 MG tablet Take 1 tablet (0.5 mg total) by mouth 2 (two) times daily as needed for anxiety. For severe anxiety attacks (Patient not taking: Reported on 10/01/2019) 15 tablet 0  . metroNIDAZOLE (METROGEL) 0.75 % vaginal gel Place vaginally at bedtime. (Patient not taking: Reported on 10/01/2019)    . nitrofurantoin, macrocrystal-monohydrate, (MACROBID) 100 MG capsule Take 100 mg by mouth 2 (two) times daily. (Patient not taking: Reported on 10/01/2019)    . QUEtiapine (SEROQUEL) 25 MG tablet Take 1 tablet (25 mg total) by mouth at bedtime. (Patient not taking: Reported on 10/01/2019) 30 tablet 0   No current facility-administered medications for this visit.     Musculoskeletal: Strength & Muscle Tone: UTA Gait & Station: normal Patient leans: N/A  Psychiatric Specialty Exam: Review of Systems  Psychiatric/Behavioral: Negative for agitation, behavioral problems, confusion, decreased concentration, dysphoric mood, hallucinations, self-injury, sleep disturbance and suicidal ideas. The patient is not  nervous/anxious and is not hyperactive.   All other systems reviewed and are negative.   There were no vitals taken for this visit.There is no height or weight on file to calculate BMI.  General Appearance: Casual  Eye Contact:  Fair  Speech:  Normal Rate  Volume:  Normal  Mood:  Euthymic  Affect:  Congruent  Thought Process:  Goal Directed and Descriptions of Associations: Intact  Orientation:  Full (Time, Place, and Person)  Thought Content: Logical   Suicidal Thoughts:  No  Homicidal Thoughts:  No  Memory:  Immediate;   Fair Recent;   Fair Remote;   Fair  Judgement:  Fair  Insight:  Fair  Psychomotor Activity:  Normal  Concentration:  Concentration: Fair and Attention Span: Fair  Recall:  [employer] of Knowledge: Fair  Language: Fair  Akathisia:  No  Handed:  Right  AIMS (if indicated): UTA  Assets:  Communication Skills Desire for Improvement Housing Social Support  ADL's:  Intact  Cognition: WNL  Sleep:  Fair   Screenings: GAD-7     Telemedicine from 09/23/2019 in [place]  Total GAD-7 Score 14    PHQ2-9     Telemedicine from 09/23/2019 in [place] Office Visit from 07/17/2019 in [place] at [facility] Visit from 10/25/2017 in [place] at [facility] Visit from 09/25/2016 in [place] at [facility] Total Score 6 4 0 0  PHQ-9 Total Score 18 17 -- --       Assessment and Plan: VELNA HEDGECOCK is a 49 year old Caucasian female, currently unemployed, lives in [place], married, has a history of anxiety, PMDD, PTSD was evaluated by telemedicine today.  She is biologically predisposed given her family history.  Patient with psychosocial stressors of job related stressors.  She is currently better compared to her last visit.  Patient likely with PMDD however will need to monitor her for major depressive disorder versus bipolar spectrum.  We will continue to  monitor patient closely.  Plan as noted below.  Plan PMDD-rule out MDD versus bipolar disorder-improving We will continue Seroquel 25 mg p.o. nightly.  Discussed with patient to use it as needed as she gets closer to her menstrual period, she has been noncompliant.  Start Celexa 5 mg p.o. daily with breakfast Continue Klonopin 0.5 mg p.o. twice daily as needed for severe anxiety symptoms.  GAD-improving Start Celexa 5 mg p.o. daily  PTSD-unstable Start Celexa 5 mg p.o. daily Continue CBT with Ms. Zadie Rhine  Collateral information obtained from husband as noted above.  Pending labs-TSH, lipid panel, hemoglobin A1c, prolactin.  Pending EKG to monitor QTC  Provided medication education.  Follow-up in clinic in 4 weeks or sooner if needed.  I have spent atleast 20 minutes face to face by video with patient today. More than 50 % of the time was spent for preparing to see the patient ( e.g., review of test, records ), obtaining and to review and separately obtained history , ordering medications and test ,psychoeducation and supportive psychotherapy and care coordination,as well as documenting clinical information in electronic health record. This note was generated in part or whole with voice recognition software. Voice recognition is usually quite accurate but there are transcription errors that can and very often do occur. I apologize for any typographical errors that were not detected and corrected.     Sharon Alert, MD 10/01/2019, 6:07 PM

## 2019-10-01 NOTE — Progress Notes (Signed)
Patient Location: Home  Provider Location: Home Office   Virtual Visit via Video Note  I connected with Sharon Orozco on 10/01/19 at 10:00 AM EDT by a video enabled telemedicine application and verified that I am speaking with the correct person using two identifiers.   I discussed the limitations of evaluation and management by telemedicine and the availability of in person appointments. The patient expressed understanding and agreed to proceed.  THERAPY PROGRESS NOTE  Session Time: 84 Minutes  Participation Level: Active  Behavioral Response: Casual and Well GroomedAlertAnxious  Type of Therapy: Individual Therapy  Treatment Goals addressed: Anxiety, Communication: Assertiveness and Coping  Interventions: CBT  Summary: Sharon Orozco is a 49 y.o. female who presents with depression and anxiety sxs. Pt reported "I don't like taking a lot of medications" and haven't started any of the meds that Dr. Shea Evans prescribed. Pt reported she needs more education around the medications and wants to "talk to my OBGYN first" in October for more testing before comfortable making a decision about starting new medication. Pt reported "I am feeling better now since Sunday, getting a lot done around the house, and not been moody". Pt would like to focus on communication. "I would like to address people in a less combative" manner and is worried about being perceived as "rude" when not prepared to talk to other people. "I can be very talkative and genuinely like people", but the menstrual cycles make it difficult. Pt identified other stressors and triggers for sxs. Pt engaged in mindfulness activity.  Suicidal/Homicidal: No  Therapist Response: Therapist met with patient for first session since completing CCA. Therapist and patient reviewed treatment plan and goals. Pt in agreement. Therapist and patient explored current stressors, thoughts and feelings. Therapist led patient in mindfulness exercise using all 5  senses. Pt was receptive.   Plan: Return again in 1 week.  Diagnosis: Axis I: Generalized Anxiety Disorder, Post Traumatic Stress Disorder and PMDD    Axis II: N/A  Josephine Igo, LCSW, LCAS 10/01/2019

## 2019-10-06 ENCOUNTER — Other Ambulatory Visit: Payer: Self-pay

## 2019-10-06 ENCOUNTER — Encounter: Payer: Self-pay | Admitting: Licensed Clinical Social Worker

## 2019-10-06 ENCOUNTER — Ambulatory Visit (INDEPENDENT_AMBULATORY_CARE_PROVIDER_SITE_OTHER): Payer: BC Managed Care – PPO | Admitting: Licensed Clinical Social Worker

## 2019-10-06 DIAGNOSIS — F3281 Premenstrual dysphoric disorder: Secondary | ICD-10-CM | POA: Diagnosis not present

## 2019-10-06 DIAGNOSIS — F4310 Post-traumatic stress disorder, unspecified: Secondary | ICD-10-CM

## 2019-10-06 DIAGNOSIS — F411 Generalized anxiety disorder: Secondary | ICD-10-CM

## 2019-10-06 NOTE — Progress Notes (Signed)
Patient Location: Home  Provider Location: Home Office   Virtual Visit via Video Note  I connected with [name] on 10/06/19 at  2:00 PM EDT by a video enabled telemedicine application and verified that I am speaking with the correct person using two identifiers.   I discussed the limitations of evaluation and management by telemedicine and the availability of in person appointments. The patient expressed understanding and agreed to proceed.  THERAPY PROGRESS NOTE  Session Time: 58 Minutes  Participation Level: Active  Behavioral Response: Neat and Well GroomedAlertAnxious  Type of Therapy: Individual Therapy  Treatment Goals addressed: Anxiety and Coping  Interventions: CBT  Summary: Sharon Orozco is a 49 y.o. female who presents with depression and anxiety sxs. Pt reported she is 4 days into starting Celexa under Dr. Charlcie Cradle care. Pt reported feeling better today overall and had a good day at her new job. Pt reported she had anxiety the night before and did not sleep well from ruminating about her new job. Pt reported she reviewed some of the mindfulness exercises from last session and found them to be helpful, particularly the ones that mention scenarios. Pt reported she is starting to engage in hobbies again and is better able to focus. Pt reported her anxiety returned just before starting this session when she received an email from another job opportunity she already turned down and worries about making the right decision.  Suicidal/Homicidal: No  Therapist Response: Therapist met with patient for follow up session. Therapist and patient reviewed homework assignment. Therapist and patient explored current stressors and use of coping skills. Therapist encouraged patient to weigh pros and cons of current position to the one she originally turned down and prioritize values. Pt was receptive.   Plan: Return again in 2 weeks.  Diagnosis: Axis I: Generalized Anxiety Disorder, Post  Traumatic Stress Disorder and PMDD    Axis II: N/A  Josephine Igo, LCSW, LCAS 10/06/2019

## 2019-10-07 ENCOUNTER — Ambulatory Visit
Admission: EM | Admit: 2019-10-07 | Discharge: 2019-10-07 | Disposition: A | Discharge status: Home / Self Care | Payer: BC Managed Care – PPO | Attending: Family Medicine | Admitting: Family Medicine

## 2019-10-07 ENCOUNTER — Other Ambulatory Visit: Payer: Self-pay

## 2019-10-07 DIAGNOSIS — R3915 Urgency of urination: Secondary | ICD-10-CM | POA: Diagnosis not present

## 2019-10-07 DIAGNOSIS — Z87891 Personal history of nicotine dependence: Secondary | ICD-10-CM | POA: Insufficient documentation

## 2019-10-07 DIAGNOSIS — R350 Frequency of micturition: Secondary | ICD-10-CM | POA: Diagnosis not present

## 2019-10-07 DIAGNOSIS — Z79899 Other long term (current) drug therapy: Secondary | ICD-10-CM | POA: Diagnosis not present

## 2019-10-07 DIAGNOSIS — Z3202 Encounter for pregnancy test, result negative: Secondary | ICD-10-CM | POA: Diagnosis not present

## 2019-10-07 DIAGNOSIS — R300 Dysuria: Secondary | ICD-10-CM | POA: Insufficient documentation

## 2019-10-07 DIAGNOSIS — N760 Acute vaginitis: Secondary | ICD-10-CM | POA: Diagnosis not present

## 2019-10-07 LAB — POCT URINALYSIS DIP (MANUAL ENTRY)
Bilirubin, UA: NEGATIVE
Blood, UA: NEGATIVE
Glucose, UA: NEGATIVE mg/dL
Ketones, POC UA: NEGATIVE mg/dL
Leukocytes, UA: NEGATIVE
Nitrite, UA: NEGATIVE
Protein Ur, POC: NEGATIVE mg/dL
Spec Grav, UA: <=1.005 — AB (ref 1.010–1.025)
Urobilinogen, UA: 0.2 E.U./dL
pH, UA: 6 (ref 5.0–8.0)

## 2019-10-07 LAB — POCT URINE PREGNANCY: Preg Test, Ur: NEGATIVE

## 2019-10-07 MED ORDER — CLINDAMYCIN PHOSPHATE 2 % VA CREA: 1.0000 | TOPICAL_CREAM | Freq: Every day | VAGINAL | 0 refills | Status: AC

## 2019-10-07 MED ORDER — FLUCONAZOLE 200 MG PO TABS: 200.0000 mg | ORAL_TABLET | Freq: Once | ORAL | 0 refills | Status: AC

## 2019-10-07 NOTE — ED Provider Notes (Signed)
[facility]   CC: UTI  SUBJECTIVE:  Sharon Orozco is a 49 y.o. female who complains of urinary frequency, urgency and dysuria, low abdominal pressure, vaginal irritation and discharge for the last 3 days. Patient denies a precipitating event, recent sexual encounter, excessive caffeine intake. Localizes the pain to the lower abdomen. Pain is intermittent and describes it as sharp. Has not attempted OTC treatment for this. Reports hx BV and yeast. Recently finished antibiotic for UTI. Reports that she has used metrogel in the past and that this still does not always resolve symptoms. Reports that oral metronidazole upsets her stomach. Symptoms are made worse with urination. Admits to similar symptoms in the past. Denies fever, chills, nausea, vomiting, flank pain, abnormal vaginal discharge or bleeding, hematuria.    LMP: Patient's last menstrual period was 09/27/2019.  ROS: As in HPI.  All other pertinent ROS negative.     Past Medical History:  Diagnosis Date  . Acne vulgaris   . Allergy   . Anxiety   . Arthritis   . Chronic vaginitis   . Depression   . GERD (gastroesophageal reflux disease)   . History of chicken pox   . Urethral polyp   . Urinary tract infection    History reviewed. No pertinent surgical history. Allergies  Allergen Reactions  . Nsaids Shortness Of Breath  . Diclofenac Other (See Comments)    Burning in mouth, Itching, Dry Eyes  . Sulfa Antibiotics Rash   No current facility-administered medications on file prior to encounter.   Current Outpatient Medications on File Prior to Encounter  Medication Sig Dispense Refill  . citalopram (CELEXA) 10 MG tablet Take 0.5 tablets (5 mg total) by mouth daily. 15 tablet 1  . clonazePAM (KLONOPIN) 0.5 MG tablet Take 1 tablet (0.5 mg total) by mouth 2 (two) times daily as needed for anxiety. For severe anxiety attacks (Patient not taking: Reported on 10/01/2019) 15 tablet 0  . nitrofurantoin,  macrocrystal-monohydrate, (MACROBID) 100 MG capsule Take 100 mg by mouth 2 (two) times daily. (Patient not taking: Reported on 10/01/2019)    . Probiotic Product (PROBIOTIC PO) Take by mouth. Olly-one tablet daily    . QUEtiapine (SEROQUEL) 25 MG tablet Take 1 tablet (25 mg total) by mouth at bedtime. (Patient not taking: Reported on 10/01/2019) 30 tablet 0   Social History   Socioeconomic History  . Marital status: Married    Spouse name: Not on file  . Number of children: 1  . Years of education: Not on file  . Highest education level: Not on file  Occupational History  . Not on file  Tobacco Use  . Smoking status: Former Research scientist (life sciences)  . Smokeless tobacco: Never Used  Substance and Sexual Activity  . Alcohol use: Yes    Alcohol/week: 0.0 standard drinks    Comment: occ glass of wine  . Drug use: No  . Sexual activity: Yes    Birth control/protection: Surgical    Comment: husband has vasectomy  Other Topics Concern  . Not on file  Social History Narrative  . Not on file   Social Determinants of Health   Financial Resource Strain:   . Difficulty of Paying Living Expenses: Not on file  Food Insecurity:   . Worried About Charity fundraiser in the Last Year: Not on file  . Ran Out of Food in the Last Year: Not on file  Transportation Needs:   . Lack of Transportation (Medical): Not on file  .  Lack of Transportation (Non-Medical): Not on file  Physical Activity:   . Days of Exercise per Week: Not on file  . Minutes of Exercise per Session: Not on file  Stress:   . Feeling of Stress : Not on file  Social Connections:   . Frequency of Communication with Friends and Family: Not on file  . Frequency of Social Gatherings with Friends and Family: Not on file  . Attends Religious Services: Not on file  . Active Member of Clubs or Organizations: Not on file  . Attends Archivist Meetings: Not on file  . Marital Status: Not on file  Intimate Partner Violence:   . Fear of  Current or Ex-Partner: Not on file  . Emotionally Abused: Not on file  . Physically Abused: Not on file  . Sexually Abused: Not on file   Family History  Adopted: Yes  Problem Relation Age of Onset  . Heart disease Father 3        MI  . Hypertension Father   . Hyperlipidemia Father   . Lymphoma Maternal Grandmother   . Kidney disease Maternal Grandmother   . Heart disease Paternal Grandfather   . Congestive Heart Failure Paternal Grandfather   . Arthritis Mother   . Hyperlipidemia Mother   . Hypertension Mother   . Alcohol abuse Paternal Grandmother   . Stroke Paternal Grandmother   . Thyroid disease Sister   . Panic disorder Sister   . Breast cancer Neg Hx     OBJECTIVE:  Vitals:   10/07/19 0903  BP: 126/80  Pulse: (!) 55  Resp: 18  Temp: 98 F (36.7 C)  TempSrc: Oral  SpO2: 98%   General appearance: AOx3 in no acute distress HEENT: NCAT. Oropharynx clear.  Lungs: clear to auscultation bilaterally without adventitious breath sounds Heart: regular rate and rhythm. Radial pulses 2+ symmetrical bilaterally Abdomen: soft; non-distended; suprapubic tenderness; bowel sounds present; no guarding or rebound tenderness Back: no CVA tenderness Extremities: no edema; symmetrical with no gross deformities Skin: warm and dry Neurologic: Ambulates from chair to exam table without difficulty Psychological: alert and cooperative; normal mood and affect  Labs Reviewed  POCT URINALYSIS DIP (MANUAL ENTRY) - Abnormal; Notable for the following components:      Result Value   Clarity, UA cloudy (*)    Spec Grav, UA <=1.005 (*)    All other components within normal limits  POCT URINE PREGNANCY - Normal  CERVICOVAGINAL ANCILLARY ONLY    ASSESSMENT & PLAN:  1. Vaginitis and vulvovaginitis   2. Dysuria   3. Urinary frequency   4. Urinary urgency   5. Negative pregnancy test    Vaginal swab collected UA negative for infection Pregnancy test negative Prescribed  clindamycin cream Prescribed fluconazole We will call you with abnormal results that need further treatment Push fluids and get plenty of rest Take antibiotic as directed and to completion Take pyridium as prescribed and as needed for symptomatic relief Follow up with PCP if symptoms persists Return here or go to ER if you have any new or worsening symptoms such as fever, worsening abdominal pain, nausea/vomiting, flank pain  Outlined signs and symptoms indicating need for more acute intervention Patient verbalized understanding After Visit Summary given     Faustino Congress, NP 10/07/19 [phone]

## 2019-10-07 NOTE — Discharge Instructions (Addendum)
Prescribed clindamycin cream. One applicator full vaginally, daily for 7 days  Prescribed fluconazole.  Take 1 tablet at the onset of symptoms, if symptoms are still present 3 days later, take the second tablet.  Swab testing will be back in about 2 days.  We will notify you of any test results that require further treatment.  Your results will also be available via MyChart.  Drink plenty of water, 8-10 glasses per day.   You may take AZO over the counter for painful urination.  Follow up with your primary care provider as needed.   Go to the Emergency Department if you experience severe pain, shortness of breath, high fever, or other concerns.

## 2019-10-07 NOTE — ED Triage Notes (Signed)
Pt presents with pressure on bladder,vaginal irritation, and vaginal discharge X 3 days.

## 2019-10-08 LAB — CERVICOVAGINAL ANCILLARY ONLY
Bacterial Vaginitis (gardnerella): NEGATIVE
Candida Glabrata: NEGATIVE
Candida Vaginitis: NEGATIVE
Chlamydia: NEGATIVE
Comment: NEGATIVE
Comment: NEGATIVE
Comment: NEGATIVE
Comment: NEGATIVE
Comment: NEGATIVE
Comment: NORMAL
Neisseria Gonorrhea: NEGATIVE
Trichomonas: NEGATIVE

## 2019-10-16 ENCOUNTER — Other Ambulatory Visit: Payer: Self-pay | Admitting: Psychiatry

## 2019-10-16 DIAGNOSIS — F411 Generalized anxiety disorder: Secondary | ICD-10-CM

## 2019-10-20 ENCOUNTER — Telehealth: Payer: Self-pay | Admitting: Psychiatry

## 2019-10-20 ENCOUNTER — Other Ambulatory Visit: Payer: Self-pay | Admitting: Psychiatry

## 2019-10-20 DIAGNOSIS — F411 Generalized anxiety disorder: Secondary | ICD-10-CM

## 2019-10-20 NOTE — Telephone Encounter (Signed)
Ok, thanks for letting me know!

## 2019-10-22 ENCOUNTER — Encounter: Payer: Self-pay | Admitting: Licensed Clinical Social Worker

## 2019-10-22 ENCOUNTER — Other Ambulatory Visit: Payer: Self-pay

## 2019-10-22 ENCOUNTER — Ambulatory Visit (INDEPENDENT_AMBULATORY_CARE_PROVIDER_SITE_OTHER): Payer: BC Managed Care – PPO | Admitting: Licensed Clinical Social Worker

## 2019-10-22 DIAGNOSIS — F3281 Premenstrual dysphoric disorder: Secondary | ICD-10-CM

## 2019-10-22 DIAGNOSIS — F4310 Post-traumatic stress disorder, unspecified: Secondary | ICD-10-CM | POA: Diagnosis not present

## 2019-10-22 DIAGNOSIS — F411 Generalized anxiety disorder: Secondary | ICD-10-CM | POA: Diagnosis not present

## 2019-10-22 NOTE — Progress Notes (Signed)
Virtual Visit via Video Note  I connected with Sharon Orozco on 10/22/19 at  2:00 PM EDT by a video enabled telemedicine application and verified that I am speaking with the correct person using two identifiers.  Location: Patient: Home Provider: Home Office   I discussed the limitations of evaluation and management by telemedicine and the availability of in person appointments. The patient expressed understanding and agreed to proceed.  THERAPY PROGRESS NOTE  Session Time: 50 Minutes  Participation Level: Active  Behavioral Response: Well GroomedAlertAnxious  Type of Therapy: Individual Therapy  Treatment Goals addressed: Anxiety and Coping  Interventions: CBT  Summary: Sharon Orozco is a 49 y.o. female who presents with depression and anxiety sxs. Pt reported she continues to enjoy working at her new job and has the desire to connect and socialize more with other women. Pt acknowledged this is important to her and still worries about what others think and often second guesses herself. Pt tied this back to seeking approval from her father who often pointed out that things could go wrong or what she does wrong. Pt requested feedback on how to cope with the anxious thoughts.  Suicidal/Homicidal: No  Therapist Response: Therapist met with patient for follow up session. Therapist and patient explored family dynamics and negative/self-defeating thoughts. Therapist provided psychoeducation on ways to both acknowledge thoughts and feelings without suppressing them and challenging these thoughts/deciding not to act on them. Therapist assigned patient homework to start a worry journal the next time she is ruminating and then categorize them under things she can or cannot control. Pt was receptive.  Plan: Return again in 2 weeks.  Diagnosis: Axis I: Generalized Anxiety Disorder, Post Traumatic Stress Disorder and PMDD    Axis II: N/A  Josephine Igo, LCSW, LCAS 10/22/2019

## 2019-10-23 ENCOUNTER — Other Ambulatory Visit: Payer: Self-pay | Admitting: Psychiatry

## 2019-10-23 DIAGNOSIS — F3281 Premenstrual dysphoric disorder: Secondary | ICD-10-CM

## 2019-10-23 DIAGNOSIS — F411 Generalized anxiety disorder: Secondary | ICD-10-CM

## 2019-10-23 DIAGNOSIS — F4310 Post-traumatic stress disorder, unspecified: Secondary | ICD-10-CM

## 2019-11-02 ENCOUNTER — Telehealth: Payer: Self-pay

## 2019-11-02 NOTE — Telephone Encounter (Signed)
Appt was canceled. - done

## 2019-11-02 NOTE — Telephone Encounter (Signed)
Appointment - Patient called to cancel her appointment with Dr. Shea Evans for 11/03/19 stating she was not able to get an appt. with ehr OBGYN until 11/12/19 so would call back after that if since Dr. Shea Evans wanted her to be seen there first for follow up.  Agreed to send message with pt's request to cancel appt and she will call back later to reschedule as needed.

## 2019-11-03 ENCOUNTER — Telehealth: Payer: BC Managed Care – PPO | Admitting: Psychiatry

## 2019-11-05 ENCOUNTER — Ambulatory Visit: Payer: BC Managed Care – PPO | Admitting: Licensed Clinical Social Worker

## 2019-11-09 DIAGNOSIS — Z20822 Contact with and (suspected) exposure to covid-19: Secondary | ICD-10-CM | POA: Diagnosis not present

## 2019-12-01 DIAGNOSIS — M7712 Lateral epicondylitis, left elbow: Secondary | ICD-10-CM | POA: Diagnosis not present

## 2020-08-19 ENCOUNTER — Other Ambulatory Visit: Payer: Self-pay

## 2020-08-24 ENCOUNTER — Other Ambulatory Visit: Payer: Self-pay | Admitting: Obstetrics and Gynecology

## 2020-08-24 ENCOUNTER — Other Ambulatory Visit: Payer: Self-pay | Admitting: Orthopedic Surgery

## 2020-08-24 DIAGNOSIS — Z1231 Encounter for screening mammogram for malignant neoplasm of breast: Secondary | ICD-10-CM

## 2020-08-29 ENCOUNTER — Ambulatory Visit: Payer: BC Managed Care – PPO | Admitting: Urology

## 2020-08-29 ENCOUNTER — Other Ambulatory Visit: Payer: Self-pay

## 2020-08-29 ENCOUNTER — Encounter: Payer: Self-pay | Admitting: Urology

## 2020-08-29 VITALS — BP 131/86 | HR 76 | Ht 66.0 in | Wt 170.0 lb

## 2020-08-29 DIAGNOSIS — N3020 Other chronic cystitis without hematuria: Secondary | ICD-10-CM | POA: Diagnosis not present

## 2020-08-29 DIAGNOSIS — N3010 Interstitial cystitis (chronic) without hematuria: Secondary | ICD-10-CM | POA: Diagnosis not present

## 2020-08-29 LAB — MICROSCOPIC EXAMINATION
Bacteria, UA: NONE SEEN
Epithelial Cells (non renal): NONE SEEN /hpf (ref 0–10)
RBC, Urine: NONE SEEN /hpf (ref 0–2)

## 2020-08-29 LAB — URINALYSIS, COMPLETE
Bilirubin, UA: NEGATIVE
Glucose, UA: NEGATIVE
Ketones, UA: NEGATIVE
Leukocytes,UA: NEGATIVE
Nitrite, UA: NEGATIVE
Protein,UA: NEGATIVE
RBC, UA: NEGATIVE
Specific Gravity, UA: 1.01 (ref 1.005–1.030)
Urobilinogen, Ur: 0.2 mg/dL (ref 0.2–1.0)
pH, UA: 5 (ref 5.0–7.5)

## 2020-08-29 MED ORDER — OXYBUTYNIN CHLORIDE ER 10 MG PO TB24: 10.0000 mg | ORAL_TABLET | Freq: Every day | ORAL | 11 refills | Status: DC

## 2020-08-29 NOTE — Progress Notes (Signed)
08/29/2020 1:28 PM   Sharon Orozco June 22, 1970 [id number]  Referring provider: Boykin Nearing, MD [address] [facility] [place],   [address]  Chief Complaint  Patient presents with   Cystitis    HPI: I was consulted to assess the patient for possible interstitial cystitis.  She voids approximately every hour during the day but did hold it for 2 hours.  She has no nocturia.  She is continent.  She can void a small amount and double void a few minutes later.  She never tried the Myrbetriq because it was $100  Monthly she has pressure in the lower back and suprapubic area with worse frequency.  She has some discomfort at the same time with intercourse but otherwise does not have dyspareunia.  She is currently being treated for recurrent vaginitis with Flagyl.  When she would flareup they thought she had an infection but she said it turns out that she did not  She denies a history of kidney stones bladder surgery urinary tract infections.  Neurologic issues.  No hysterectomy.  Bowel movements normal   PMH: Past Medical History:  Diagnosis Date   Acne vulgaris    Allergy    Anxiety    Arthritis    Chronic vaginitis    Depression    GERD (gastroesophageal reflux disease)    History of chicken pox    Urethral polyp    Urinary tract infection     Surgical History: No past surgical history on file.  Home Medications:  Allergies as of 08/29/2020       Reactions   Nsaids Shortness Of Breath   Diclofenac Other (See Comments)   Burning in mouth, Itching, Dry Eyes   Sulfa Antibiotics Rash        Medication List        Accurate as of August 29, 2020  1:28 PM. If you have any questions, ask your nurse or doctor.          STOP taking these medications    citalopram 10 MG tablet Commonly known as: CeleXA Stopped by: Reece Packer, MD   clonazePAM 0.5 MG tablet Commonly known as: KlonoPIN Stopped by: Reece Packer,  MD   nitrofurantoin (macrocrystal-monohydrate) 100 MG capsule Commonly known as: MACROBID Stopped by: Reece Packer, MD   QUEtiapine 25 MG tablet Commonly known as: SEROquel Stopped by: Reece Packer, MD       TAKE these medications    metroNIDAZOLE 0.75 % vaginal gel Commonly known as: [place] vaginally at bedtime.   PROBIOTIC PO Take by mouth. Olly-one tablet daily        Allergies:  Allergies  Allergen Reactions   Nsaids Shortness Of Breath   Diclofenac Other (See Comments)    Burning in mouth, Itching, Dry Eyes   Sulfa Antibiotics Rash    Family History: Family History  Adopted: Yes  Problem Relation Age of Onset   Heart disease Father 35        MI   Hypertension Father    Hyperlipidemia Father    Lymphoma Maternal Grandmother    Kidney disease Maternal Grandmother    Heart disease Paternal Grandfather    Congestive Heart Failure Paternal Grandfather    Arthritis Mother    Hyperlipidemia Mother    Hypertension Mother    Alcohol abuse Paternal Grandmother    Stroke Paternal Grandmother    Thyroid disease Sister    Panic disorder Sister  Breast cancer Neg Hx     Social History:  reports that she has quit smoking. She has never used smokeless tobacco. She reports current alcohol use. She reports that she does not use drugs.  ROS:                                        Physical Exam: BP 131/86   Pulse 76   Ht '5\' 6"'$  (1.676 m)   Wt 77.1 kg   BMI 27.44 kg/m   Constitutional:  Alert and oriented, No acute distress. HEENT: [place] AT, moist mucus membranes.  Trachea midline, no masses. Cardiovascular: No clubbing, cyanosis, or edema. Respiratory: Normal respiratory effort, no increased work of breathing. GI: Abdomen is soft, nontender, nondistended, no abdominal masses GU: Good bladder support with no stress incontinence.  No bladder or levator muscle tenderness Skin: No rashes, bruises or suspicious  lesions. Lymph: No cervical or inguinal adenopathy. Neurologic: Grossly intact, no focal deficits, moving all 4 extremities. Psychiatric: Normal mood and affect.  Laboratory Data: Lab Results  Component Value Date   WBC 7.7 11/14/2018   HGB 13.1 11/14/2018   HCT 38.7 11/14/2018   MCV 88.6 11/14/2018   PLT 240 11/14/2018    Lab Results  Component Value Date   CREATININE 0.68 11/14/2018    No results found for: PSA  Lab Results  Component Value Date   TESTOSTERONE 28 01/13/2015    Lab Results  Component Value Date   HGBA1C 5.5 09/25/2019    Urinalysis    Component Value Date/Time   COLORURINE STRAW (A) 08/18/2015 1633   APPEARANCEUR CLEAR 08/18/2015 1633   LABSPEC <1.005 (L) 08/18/2015 1633   PHURINE 5.0 08/18/2015 1633   GLUCOSEU NEGATIVE 08/18/2015 1633   HGBUR NEGATIVE 08/18/2015 1633   BILIRUBINUR negative 10/07/2019 0914   KETONESUR negative 10/07/2019 0914   KETONESUR NEGATIVE 08/18/2015 1633   PROTEINUR negative 10/07/2019 0914   PROTEINUR NEGATIVE 08/18/2015 1633   UROBILINOGEN 0.2 10/07/2019 0914   NITRITE Negative 10/07/2019 0914   NITRITE NEGATIVE 08/18/2015 1633   LEUKOCYTESUR Negative 10/07/2019 0914    Pertinent Imaging: Urine reviewed.  Urine sent for culture.  Chart reviewed.  Assessment & Plan: Patient understands that she could have interstitial cystitis.  Bladder hydrodistention discussed in detail.  I want to try her on overactive bladder medication initially.  Call if urine culture positive.  Had a recent pelvic ultrasound.  I mention doing cystoscopy in the office but I do not think we need to do this here for doing a hydrodistention in the future and she agreed.  Today no microscopic hematuria  1. IC (interstitial cystitis)  - Urinalysis, Complete - CULTURE, URINE COMPREHENSIVE   No follow-ups on file.  Reece Packer, MD  [place] [address] [place], [place] [address] [phone]

## 2020-09-01 LAB — CULTURE, URINE COMPREHENSIVE

## 2020-09-07 ENCOUNTER — Other Ambulatory Visit: Payer: Self-pay

## 2020-09-07 ENCOUNTER — Ambulatory Visit
Admission: RE | Admit: 2020-09-07 | Discharge: 2020-09-07 | Disposition: A | Discharge status: Home / Self Care | Payer: BC Managed Care – PPO | Source: Ambulatory Visit | Attending: Obstetrics and Gynecology | Admitting: Obstetrics and Gynecology

## 2020-09-07 DIAGNOSIS — Z1231 Encounter for screening mammogram for malignant neoplasm of breast: Secondary | ICD-10-CM | POA: Insufficient documentation

## 2020-10-10 ENCOUNTER — Ambulatory Visit: Payer: BC Managed Care – PPO | Admitting: Urology

## 2021-05-01 DIAGNOSIS — R1030 Lower abdominal pain, unspecified: Secondary | ICD-10-CM | POA: Diagnosis not present

## 2021-05-01 DIAGNOSIS — N760 Acute vaginitis: Secondary | ICD-10-CM | POA: Diagnosis not present

## 2021-07-05 ENCOUNTER — Ambulatory Visit: Payer: BC Managed Care – PPO | Admitting: Gastroenterology

## 2021-08-07 ENCOUNTER — Other Ambulatory Visit: Payer: Self-pay | Admitting: Obstetrics and Gynecology

## 2021-08-07 DIAGNOSIS — Z1231 Encounter for screening mammogram for malignant neoplasm of breast: Secondary | ICD-10-CM

## 2021-09-08 ENCOUNTER — Ambulatory Visit
Admission: RE | Admit: 2021-09-08 | Discharge: 2021-09-08 | Disposition: A | Discharge status: Home / Self Care | Payer: BC Managed Care – PPO | Source: Ambulatory Visit | Attending: Obstetrics and Gynecology | Admitting: Obstetrics and Gynecology

## 2021-09-08 DIAGNOSIS — Z1231 Encounter for screening mammogram for malignant neoplasm of breast: Secondary | ICD-10-CM | POA: Diagnosis not present

## 2021-09-11 DIAGNOSIS — Z01411 Encounter for gynecological examination (general) (routine) with abnormal findings: Secondary | ICD-10-CM | POA: Diagnosis not present

## 2021-09-11 DIAGNOSIS — Z1331 Encounter for screening for depression: Secondary | ICD-10-CM | POA: Diagnosis not present

## 2021-09-11 DIAGNOSIS — N926 Irregular menstruation, unspecified: Secondary | ICD-10-CM | POA: Diagnosis not present

## 2021-09-11 DIAGNOSIS — Z1211 Encounter for screening for malignant neoplasm of colon: Secondary | ICD-10-CM | POA: Diagnosis not present

## 2021-09-11 DIAGNOSIS — Z124 Encounter for screening for malignant neoplasm of cervix: Secondary | ICD-10-CM | POA: Diagnosis not present

## 2021-09-11 DIAGNOSIS — N898 Other specified noninflammatory disorders of vagina: Secondary | ICD-10-CM | POA: Diagnosis not present

## 2021-11-10 DIAGNOSIS — H11441 Conjunctival cysts, right eye: Secondary | ICD-10-CM | POA: Diagnosis not present

## 2022-02-26 ENCOUNTER — Emergency Department: Payer: BC Managed Care – PPO

## 2022-02-26 ENCOUNTER — Other Ambulatory Visit: Payer: Self-pay

## 2022-02-26 ENCOUNTER — Emergency Department
Admission: EM | Admit: 2022-02-26 | Discharge: 2022-02-26 | Disposition: A | Discharge status: Home / Self Care | Payer: BC Managed Care – PPO | Attending: Emergency Medicine | Admitting: Emergency Medicine

## 2022-02-26 ENCOUNTER — Encounter: Payer: Self-pay | Admitting: Emergency Medicine

## 2022-02-26 DIAGNOSIS — S161XXA Strain of muscle, fascia and tendon at neck level, initial encounter: Secondary | ICD-10-CM | POA: Insufficient documentation

## 2022-02-26 DIAGNOSIS — M25511 Pain in right shoulder: Secondary | ICD-10-CM | POA: Diagnosis not present

## 2022-02-26 DIAGNOSIS — M542 Cervicalgia: Secondary | ICD-10-CM | POA: Diagnosis not present

## 2022-02-26 DIAGNOSIS — Y92410 Unspecified street and highway as the place of occurrence of the external cause: Secondary | ICD-10-CM | POA: Insufficient documentation

## 2022-02-26 DIAGNOSIS — M25512 Pain in left shoulder: Secondary | ICD-10-CM | POA: Insufficient documentation

## 2022-02-26 DIAGNOSIS — S0990XA Unspecified injury of head, initial encounter: Secondary | ICD-10-CM | POA: Insufficient documentation

## 2022-02-26 DIAGNOSIS — R079 Chest pain, unspecified: Secondary | ICD-10-CM | POA: Diagnosis not present

## 2022-02-26 DIAGNOSIS — S199XXA Unspecified injury of neck, initial encounter: Secondary | ICD-10-CM | POA: Diagnosis not present

## 2022-02-26 DIAGNOSIS — I10 Essential (primary) hypertension: Secondary | ICD-10-CM | POA: Diagnosis not present

## 2022-02-26 MED ORDER — CYCLOBENZAPRINE HCL 10 MG PO TABS: 10.0000 mg | ORAL_TABLET | Freq: Three times a day (TID) | ORAL | 0 refills | Status: DC | PRN

## 2022-02-26 MED ORDER — ACETAMINOPHEN 325 MG PO TABS: 650.0000 mg | ORAL_TABLET | Freq: Once | ORAL | Status: DC

## 2022-02-26 NOTE — Discharge Instructions (Signed)
Use ice for the next 3 days Follow-up with orthopedics if not improving in 1 week Return to the emergency department if you are worsening
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
## 2022-03-07 ENCOUNTER — Other Ambulatory Visit: Payer: Self-pay | Admitting: Orthopedic Surgery

## 2022-03-07 DIAGNOSIS — M25512 Pain in left shoulder: Secondary | ICD-10-CM | POA: Diagnosis not present

## 2022-03-07 DIAGNOSIS — F4310 Post-traumatic stress disorder, unspecified: Secondary | ICD-10-CM

## 2022-03-07 DIAGNOSIS — F4311 Post-traumatic stress disorder, acute: Secondary | ICD-10-CM | POA: Diagnosis not present

## 2022-03-07 DIAGNOSIS — M542 Cervicalgia: Secondary | ICD-10-CM | POA: Diagnosis not present

## 2022-03-07 DIAGNOSIS — M25511 Pain in right shoulder: Secondary | ICD-10-CM | POA: Diagnosis not present

## 2022-03-21 DIAGNOSIS — M542 Cervicalgia: Secondary | ICD-10-CM | POA: Diagnosis not present

## 2022-03-29 DIAGNOSIS — M542 Cervicalgia: Secondary | ICD-10-CM | POA: Diagnosis not present

## 2022-05-03 DIAGNOSIS — F419 Anxiety disorder, unspecified: Secondary | ICD-10-CM | POA: Diagnosis not present

## 2022-05-09 DIAGNOSIS — M7541 Impingement syndrome of right shoulder: Secondary | ICD-10-CM | POA: Diagnosis not present

## 2022-05-09 DIAGNOSIS — M898X1 Other specified disorders of bone, shoulder: Secondary | ICD-10-CM | POA: Diagnosis not present

## 2022-05-11 DIAGNOSIS — F419 Anxiety disorder, unspecified: Secondary | ICD-10-CM | POA: Diagnosis not present

## 2022-05-11 DIAGNOSIS — F4312 Post-traumatic stress disorder, chronic: Secondary | ICD-10-CM | POA: Diagnosis not present

## 2022-05-22 DIAGNOSIS — R293 Abnormal posture: Secondary | ICD-10-CM | POA: Diagnosis not present

## 2022-05-22 DIAGNOSIS — M542 Cervicalgia: Secondary | ICD-10-CM | POA: Diagnosis not present

## 2022-05-22 DIAGNOSIS — M25511 Pain in right shoulder: Secondary | ICD-10-CM | POA: Diagnosis not present

## 2022-05-25 DIAGNOSIS — F4312 Post-traumatic stress disorder, chronic: Secondary | ICD-10-CM | POA: Diagnosis not present

## 2022-05-25 DIAGNOSIS — F419 Anxiety disorder, unspecified: Secondary | ICD-10-CM | POA: Diagnosis not present

## 2022-05-30 DIAGNOSIS — M25511 Pain in right shoulder: Secondary | ICD-10-CM | POA: Diagnosis not present

## 2022-05-30 DIAGNOSIS — R293 Abnormal posture: Secondary | ICD-10-CM | POA: Diagnosis not present

## 2022-05-30 DIAGNOSIS — M542 Cervicalgia: Secondary | ICD-10-CM | POA: Diagnosis not present

## 2022-06-01 DIAGNOSIS — F419 Anxiety disorder, unspecified: Secondary | ICD-10-CM | POA: Diagnosis not present

## 2022-06-01 DIAGNOSIS — F4312 Post-traumatic stress disorder, chronic: Secondary | ICD-10-CM | POA: Diagnosis not present

## 2022-06-05 DIAGNOSIS — M542 Cervicalgia: Secondary | ICD-10-CM | POA: Diagnosis not present

## 2022-06-05 DIAGNOSIS — R293 Abnormal posture: Secondary | ICD-10-CM | POA: Diagnosis not present

## 2022-06-05 DIAGNOSIS — M25511 Pain in right shoulder: Secondary | ICD-10-CM | POA: Diagnosis not present

## 2022-06-14 DIAGNOSIS — F419 Anxiety disorder, unspecified: Secondary | ICD-10-CM | POA: Diagnosis not present

## 2022-06-14 DIAGNOSIS — F4312 Post-traumatic stress disorder, chronic: Secondary | ICD-10-CM | POA: Diagnosis not present

## 2022-06-19 DIAGNOSIS — M542 Cervicalgia: Secondary | ICD-10-CM | POA: Diagnosis not present

## 2022-06-19 DIAGNOSIS — R293 Abnormal posture: Secondary | ICD-10-CM | POA: Diagnosis not present

## 2022-06-19 DIAGNOSIS — M25511 Pain in right shoulder: Secondary | ICD-10-CM | POA: Diagnosis not present

## 2022-06-27 DIAGNOSIS — M7541 Impingement syndrome of right shoulder: Secondary | ICD-10-CM | POA: Diagnosis not present

## 2022-07-03 DIAGNOSIS — F4312 Post-traumatic stress disorder, chronic: Secondary | ICD-10-CM | POA: Diagnosis not present

## 2022-07-03 DIAGNOSIS — F419 Anxiety disorder, unspecified: Secondary | ICD-10-CM | POA: Diagnosis not present

## 2022-07-19 DIAGNOSIS — F419 Anxiety disorder, unspecified: Secondary | ICD-10-CM | POA: Diagnosis not present

## 2022-07-19 DIAGNOSIS — F4312 Post-traumatic stress disorder, chronic: Secondary | ICD-10-CM | POA: Diagnosis not present

## 2022-08-08 ENCOUNTER — Other Ambulatory Visit: Payer: Self-pay | Admitting: Obstetrics and Gynecology

## 2022-08-08 DIAGNOSIS — Z1231 Encounter for screening mammogram for malignant neoplasm of breast: Secondary | ICD-10-CM

## 2022-09-11 ENCOUNTER — Encounter: Payer: Self-pay | Admitting: Radiology

## 2022-09-11 ENCOUNTER — Ambulatory Visit
Admission: RE | Admit: 2022-09-11 | Discharge: 2022-09-11 | Disposition: A | Discharge status: Home / Self Care | Payer: BC Managed Care – PPO | Source: Ambulatory Visit | Attending: Obstetrics and Gynecology | Admitting: Obstetrics and Gynecology

## 2022-09-11 DIAGNOSIS — Z1231 Encounter for screening mammogram for malignant neoplasm of breast: Secondary | ICD-10-CM | POA: Diagnosis not present

## 2022-09-27 ENCOUNTER — Ambulatory Visit: Payer: BC Managed Care – PPO | Admitting: Nurse Practitioner

## 2022-09-27 ENCOUNTER — Encounter: Payer: Self-pay | Admitting: Nurse Practitioner

## 2022-09-27 VITALS — BP 112/82 | HR 64 | Temp 98.7°F | Ht 66.0 in | Wt 168.2 lb

## 2022-09-27 DIAGNOSIS — I83812 Varicose veins of left lower extremities with pain: Secondary | ICD-10-CM | POA: Insufficient documentation

## 2022-09-27 DIAGNOSIS — Z13220 Encounter for screening for lipoid disorders: Secondary | ICD-10-CM | POA: Diagnosis not present

## 2022-09-27 DIAGNOSIS — Z23 Encounter for immunization: Secondary | ICD-10-CM | POA: Diagnosis not present

## 2022-09-27 DIAGNOSIS — Z131 Encounter for screening for diabetes mellitus: Secondary | ICD-10-CM

## 2022-09-27 DIAGNOSIS — N951 Menopausal and female climacteric states: Secondary | ICD-10-CM | POA: Diagnosis not present

## 2022-09-27 DIAGNOSIS — Z1329 Encounter for screening for other suspected endocrine disorder: Secondary | ICD-10-CM

## 2022-09-27 DIAGNOSIS — R102 Pelvic and perineal pain: Secondary | ICD-10-CM | POA: Insufficient documentation

## 2022-09-27 DIAGNOSIS — Z0001 Encounter for general adult medical examination with abnormal findings: Secondary | ICD-10-CM | POA: Insufficient documentation

## 2022-09-27 DIAGNOSIS — Z1211 Encounter for screening for malignant neoplasm of colon: Secondary | ICD-10-CM

## 2022-09-27 DIAGNOSIS — Z0000 Encounter for general adult medical examination without abnormal findings: Secondary | ICD-10-CM

## 2022-09-27 HISTORY — DX: Encounter for general adult medical examination with abnormal findings: Z00.01

## 2022-09-27 LAB — CBC WITH DIFFERENTIAL/PLATELET
Basophils Absolute: 0 10*3/uL (ref 0.0–0.1)
Basophils Relative: 0.8 % (ref 0.0–3.0)
Eosinophils Absolute: 0.1 10*3/uL (ref 0.0–0.7)
Eosinophils Relative: 0.9 % (ref 0.0–5.0)
HCT: 39.2 % (ref 36.0–46.0)
Hemoglobin: 12.8 g/dL (ref 12.0–15.0)
Lymphocytes Relative: 34 % (ref 12.0–46.0)
Lymphs Abs: 2 10*3/uL (ref 0.7–4.0)
MCHC: 32.5 g/dL (ref 30.0–36.0)
MCV: 90.8 fl (ref 78.0–100.0)
Monocytes Absolute: 0.5 10*3/uL (ref 0.1–1.0)
Monocytes Relative: 8.7 % (ref 3.0–12.0)
Neutro Abs: 3.2 10*3/uL (ref 1.4–7.7)
Neutrophils Relative %: 55.6 % (ref 43.0–77.0)
Platelets: 225 10*3/uL (ref 150.0–400.0)
RBC: 4.32 Mil/uL (ref 3.87–5.11)
RDW: 13 % (ref 11.5–15.5)
WBC: 5.8 10*3/uL (ref 4.0–10.5)

## 2022-09-27 LAB — LIPID PANEL
Cholesterol: 174 mg/dL (ref 0–200)
HDL: 55.7 mg/dL (ref >39.00)
LDL Cholesterol: 104 mg/dL — ABNORMAL HIGH (ref 0–99)
NonHDL: 118.02
Total CHOL/HDL Ratio: 3
Triglycerides: 68 mg/dL (ref 0.0–149.0)
VLDL: 13.6 mg/dL (ref 0.0–40.0)

## 2022-09-27 LAB — COMPREHENSIVE METABOLIC PANEL
ALT: 26 U/L (ref 0–35)
AST: 24 U/L (ref 0–37)
Albumin: 4 g/dL (ref 3.5–5.2)
Alkaline Phosphatase: 38 U/L — ABNORMAL LOW (ref 39–117)
BUN: 10 mg/dL (ref 6–23)
CO2: 28 mEq/L (ref 19–32)
Calcium: 9.1 mg/dL (ref 8.4–10.5)
Chloride: 106 mEq/L (ref 96–112)
Creatinine, Ser: 0.59 mg/dL (ref 0.40–1.20)
GFR: 105.36 mL/min (ref >60.00)
Glucose, Bld: 90 mg/dL (ref 70–99)
Potassium: 3.9 mEq/L (ref 3.5–5.1)
Sodium: 139 mEq/L (ref 135–145)
Total Bilirubin: 0.5 mg/dL (ref 0.2–1.2)
Total Protein: 6.7 g/dL (ref 6.0–8.3)

## 2022-09-27 LAB — TSH: TSH: 1.07 u[IU]/mL (ref 0.35–5.50)

## 2022-09-27 LAB — HEMOGLOBIN A1C: Hgb A1c MFr Bld: 5.7 % (ref 4.6–6.5)

## 2022-09-27 LAB — VITAMIN D 25 HYDROXY (VIT D DEFICIENCY, FRACTURES): VITD: 36.52 ng/mL (ref 30.00–100.00)

## 2022-09-27 NOTE — Progress Notes (Signed)
Bethanie Dicker, NP-C Phone: [phone]  Sharon Orozco is a 52 y.o. female who presents today to establish care and for annual exam.   Diet: Not many carbs, cooks at home, increased vegetables, needs to increase protein Exercise: 35-55 minutes, 5 days per week- aerobic kickboxing, treadmill, weights, pilates Pap smear: 10/17/2018- see's Ob-Gyn Colonoscopy: Never- Due! Mammogram: 09/11/2022 Family history-  Colon cancer: No  Breast cancer: Yes, maternal aunt  Ovarian cancer: No Menses: LMP- 09/13/2022- irregular cycles, she does have heavy bleeding Sexually active: Yes Vaccines-   Flu: Not due  Tetanus: 09/25/2016  Shingles: Interested- 1st dose today  COVID19: x 2 HIV screening: Declined Hep C Screening: Declined Tobacco use: No Alcohol use: No Illicit Drug use: No Dentist: Yes Ophthalmology: Yes   Active Ambulatory Problems    Diagnosis Date Noted   PMDD (premenstrual dysphoric disorder) 01/13/2015   Uterine fibroid 01/13/2015   Allergic rhinitis 01/13/2015   Family history of premature CAD 01/13/2015   Fibromyalgia syndrome 02/28/2015   Chronic vaginitis 09/20/2015   GAD (generalized anxiety disorder) 09/23/2019   PTSD (post-traumatic stress disorder) 09/23/2019   Bipolar affective disorder, current episode mixed (HCC) 09/23/2019   High risk medication use 09/23/2019   Pelvic pain in female 09/27/2022   Encounter for routine adult medical exam with abnormal findings 09/27/2022   Menopausal syndrome 09/27/2022   Varicose veins of left lower extremity with pain 09/27/2022   Resolved Ambulatory Problems    Diagnosis Date Noted   Chronic insomnia 01/13/2015   GERD (gastroesophageal reflux disease) 01/13/2015   Acne vulgaris 01/13/2015   Abnormal facial hair 01/13/2015   Recurrent candidiasis of vagina 01/28/2015   De Quervain's disease (tenosynovitis) 09/30/2016   Sore throat 10/25/2017   Past Medical History:  Diagnosis Date   Allergy    Anxiety    Arthritis     Depression    History of chicken pox    Urethral polyp    Urinary tract infection     Family History  Adopted: Yes  Problem Relation Age of Onset   Heart disease Father 52        MI   Hypertension Father    Hyperlipidemia Father    Lymphoma Maternal Grandmother    Kidney disease Maternal Grandmother    Cancer Maternal Grandmother    Varicose Veins Maternal Grandmother    Heart disease Paternal Grandfather    Congestive Heart Failure Paternal Grandfather    Arthritis Mother    Hyperlipidemia Mother    Hypertension Mother    Miscarriages / Stillbirths Mother    Varicose Veins Mother    Alcohol abuse Paternal Grandmother    Stroke Paternal Grandmother    Heart disease Paternal Grandmother    Thyroid disease Sister    Panic disorder Sister    Anxiety disorder Sister    Varicose Veins Sister    Cancer Maternal Aunt    Cancer Maternal Uncle    Breast cancer Neg Hx     Social History   Socioeconomic History   Marital status: Married    Spouse name: Not on file   Number of children: 1   Years of education: Not on file   Highest education level: Not on file  Occupational History   Not on file  Tobacco Use   Smoking status: Former    Current packs/day: 0.00    Average packs/day: 0.5 packs/day for 2.0 years (1.0 ttl pk-yrs)    Types: Cigarettes    Quit date: 10/01/1993  Years since quitting: 29.0   Smokeless tobacco: Never  Substance and Sexual Activity   Alcohol use: Not Currently    Comment: Wine occasionally   Drug use: Never   Sexual activity: Yes    Birth control/protection: Surgical, Other-see comments    Comment: Husband vasectomy  Other Topics Concern   Not on file  Social History Narrative   Not on file   Social Determinants of Health   Financial Resource Strain: Not on file  Food Insecurity: Not on file  Transportation Needs: Not on file  Physical Activity: Not on file  Stress: Not on file  Social Connections: Not on file  Intimate Partner  Violence: Not on file    ROS  General:  Negative for unexplained weight loss, fever Skin: Negative for new or changing mole, sore that won't heal HEENT: Negative for trouble hearing, trouble seeing, ringing in ears, mouth sores, hoarseness, change in voice, dysphagia. CV:  Negative for chest pain, dyspnea, edema, palpitations Resp: Negative for cough, dyspnea, hemoptysis GI: Negative for nausea, vomiting, diarrhea, constipation, abdominal pain, melena, hematochezia. GU: Negative for dysuria, incontinence, urinary hesitance, hematuria, vaginal or penile discharge, polyuria, sexual difficulty, lumps in testicle or breasts MSK: Negative for muscle cramps or aches, joint pain or swelling Neuro: Negative for headaches, weakness, numbness, dizziness, passing out/fainting Psych: Negative for depression, anxiety, memory problems  Objective  Physical Exam Vitals:   09/27/22 0955  BP: 112/82  Pulse: 64  Temp: 98.7 F (37.1 C)  SpO2: 98%    BP Readings from Last 3 Encounters:  09/27/22 112/82  02/26/22 137/87  08/29/20 131/86   Wt Readings from Last 3 Encounters:  09/27/22 168 lb 3.2 oz (76.3 kg)  02/26/22 169 lb 15.6 oz (77.1 kg)  08/29/20 170 lb (77.1 kg)    Physical Exam Constitutional:      General: She is not in acute distress.    Appearance: Normal appearance.  HENT:     Head: Normocephalic.     Right Ear: Tympanic membrane normal.     Left Ear: Tympanic membrane normal.     Nose: Nose normal.     Mouth/Throat:     Mouth: Mucous membranes are moist.     Pharynx: Oropharynx is clear.  Eyes:     Conjunctiva/sclera: Conjunctivae normal.     Pupils: Pupils are equal, round, and reactive to light.  Neck:     Thyroid: No thyromegaly.  Cardiovascular:     Rate and Rhythm: Normal rate and regular rhythm.     Heart sounds: Normal heart sounds.  Pulmonary:     Effort: Pulmonary effort is normal.     Breath sounds: Normal breath sounds.  Abdominal:     General: Abdomen  is flat. Bowel sounds are normal.     Palpations: Abdomen is soft. There is no mass.     Tenderness: There is no abdominal tenderness.  Musculoskeletal:        General: Normal range of motion.  Lymphadenopathy:     Cervical: No cervical adenopathy.  Skin:    General: Skin is warm and dry.     Findings: No rash.  Neurological:     General: No focal deficit present.     Mental Status: She is alert.  Psychiatric:        Mood and Affect: Mood normal.        Behavior: Behavior normal.    Assessment/Plan:   Encounter for routine adult medical exam with abnormal findings Assessment & Plan:  Physical exam complete. Lab work as outlined. Will contact patient with results. Pap- UTD. Colonoscopy- due, patient agreeable to Cologuard. Order placed, encouraged to complete as soon as it arrives. Mammogram- UTD. Flu vaccine not due. Tetanus vaccine- UTD. Declined additional COVID vaccines. Shingles vaccine- 1st dose given today in office. She will return in 2-6 months for the 2nd dose. Counseled on common side effects. Declined HIV and Hep C screenings. Recommended follow ups with Dentist and Ophthalmology for annual exams. Encouraged to continue healthy diet and exercise. Return to care in 2-6 months for 2nd Shingles vaccine then in one year, sooner PRN.   Orders: -     CBC with Differential/Platelet -     Comprehensive metabolic panel  Menopausal syndrome Assessment & Plan: She will discuss further with her Ob-Gyn next month at her appointment. Experiencing hot flashes, difficulty sleeping, and vaginal dryness. She is still having periods, not regular cycles. Encouraged follow up with Ob-Gyn. She will follow up with me if needed regarding her symptoms. Will continue to monitor.   Orders: -     VITAMIN D 25 Hydroxy (Vit-D Deficiency, Fractures)  Varicose veins of left lower extremity with pain Assessment & Plan: Will refer to Vascular Surgery for further evaluation.   Orders: -     Ambulatory  referral to Vascular Surgery  Screen for colon cancer -     Cologuard  Thyroid disorder screen -     TSH  Lipid screening -     Lipid panel  Diabetes mellitus screening -     Hemoglobin A1c  Need for shingles vaccine -     Varicella-zoster vaccine IM    Return in about 1 year (around 09/27/2023) for Annual Exam, sooner PRN.   Bethanie Dicker, NP-C [facility] - [employer]

## 2022-09-27 NOTE — Assessment & Plan Note (Signed)
She will discuss further with her Ob-Gyn next month at her appointment. Experiencing hot flashes, difficulty sleeping, and vaginal dryness. She is still having periods, not regular cycles. Encouraged follow up with Ob-Gyn. She will follow up with me if needed regarding her symptoms. Will continue to monitor.

## 2022-09-27 NOTE — Assessment & Plan Note (Signed)
Will refer to Vascular Surgery for further evaluation.

## 2022-09-27 NOTE — Assessment & Plan Note (Signed)
Physical exam complete. Lab work as outlined. Will contact patient with results. Pap- UTD. Colonoscopy- due, patient agreeable to Cologuard. Order placed, encouraged to complete as soon as it arrives. Mammogram- UTD. Flu vaccine not due. Tetanus vaccine- UTD. Declined additional COVID vaccines. Shingles vaccine- 1st dose given today in office. She will return in 2-6 months for the 2nd dose. Counseled on common side effects. Declined HIV and Hep C screenings. Recommended follow ups with Dentist and Ophthalmology for annual exams. Encouraged to continue healthy diet and exercise. Return to care in 2-6 months for 2nd Shingles vaccine then in one year, sooner PRN.

## 2022-10-08 DIAGNOSIS — Z1211 Encounter for screening for malignant neoplasm of colon: Secondary | ICD-10-CM | POA: Diagnosis not present

## 2022-10-13 LAB — COLOGUARD: COLOGUARD: NEGATIVE

## 2022-10-30 ENCOUNTER — Encounter (INDEPENDENT_AMBULATORY_CARE_PROVIDER_SITE_OTHER): Payer: Self-pay | Admitting: Vascular Surgery

## 2022-10-30 ENCOUNTER — Encounter (INDEPENDENT_AMBULATORY_CARE_PROVIDER_SITE_OTHER): Payer: Self-pay

## 2022-11-06 DIAGNOSIS — F4312 Post-traumatic stress disorder, chronic: Secondary | ICD-10-CM | POA: Diagnosis not present

## 2022-11-06 DIAGNOSIS — F419 Anxiety disorder, unspecified: Secondary | ICD-10-CM | POA: Diagnosis not present

## 2022-11-22 DIAGNOSIS — F4312 Post-traumatic stress disorder, chronic: Secondary | ICD-10-CM | POA: Diagnosis not present

## 2022-11-22 DIAGNOSIS — F419 Anxiety disorder, unspecified: Secondary | ICD-10-CM | POA: Diagnosis not present

## 2022-12-11 ENCOUNTER — Ambulatory Visit (INDEPENDENT_AMBULATORY_CARE_PROVIDER_SITE_OTHER): Payer: BC Managed Care – PPO | Admitting: Internal Medicine

## 2022-12-11 DIAGNOSIS — Z23 Encounter for immunization: Secondary | ICD-10-CM

## 2022-12-11 NOTE — Progress Notes (Unsigned)
Patient presented for 2nd shingles vaccine injection to right deltoid, patient voiced no concerns nor showed any signs of distress during injection

## 2023-01-31 DIAGNOSIS — F4312 Post-traumatic stress disorder, chronic: Secondary | ICD-10-CM | POA: Diagnosis not present

## 2023-01-31 DIAGNOSIS — F419 Anxiety disorder, unspecified: Secondary | ICD-10-CM | POA: Diagnosis not present

## 2023-02-28 DIAGNOSIS — F419 Anxiety disorder, unspecified: Secondary | ICD-10-CM | POA: Diagnosis not present

## 2023-02-28 DIAGNOSIS — F4312 Post-traumatic stress disorder, chronic: Secondary | ICD-10-CM | POA: Diagnosis not present

## 2023-03-10 IMAGING — MG MM DIGITAL SCREENING BILAT W/ TOMO AND CAD
8 series · 8 of 24 positions shown · non-contrast
Comparison: Previous exam(s).

CLINICAL DATA: Screening.

EXAM:
DIGITAL SCREENING BILATERAL MAMMOGRAM WITH TOMOSYNTHESIS AND CAD
TECHNIQUE: Bilateral screening digital craniocaudal and mediolateral oblique
mammograms were obtained. Bilateral screening digital breast
tomosynthesis was performed. The images were evaluated with
computer-aided detection.

[L MLO synth-2D]
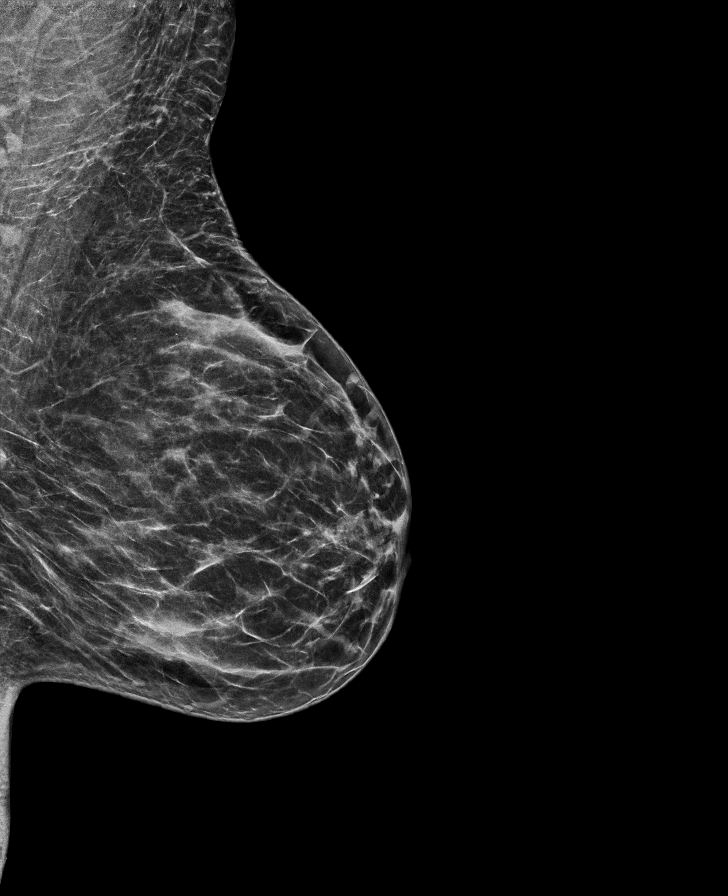

[L CC synth-2D]
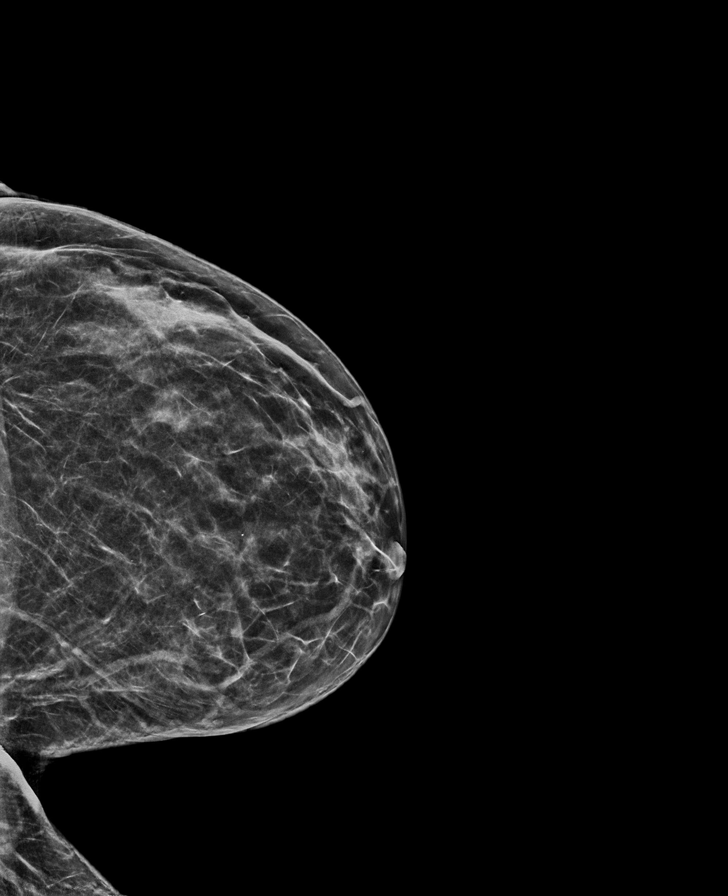

[R MLO synth-2D]
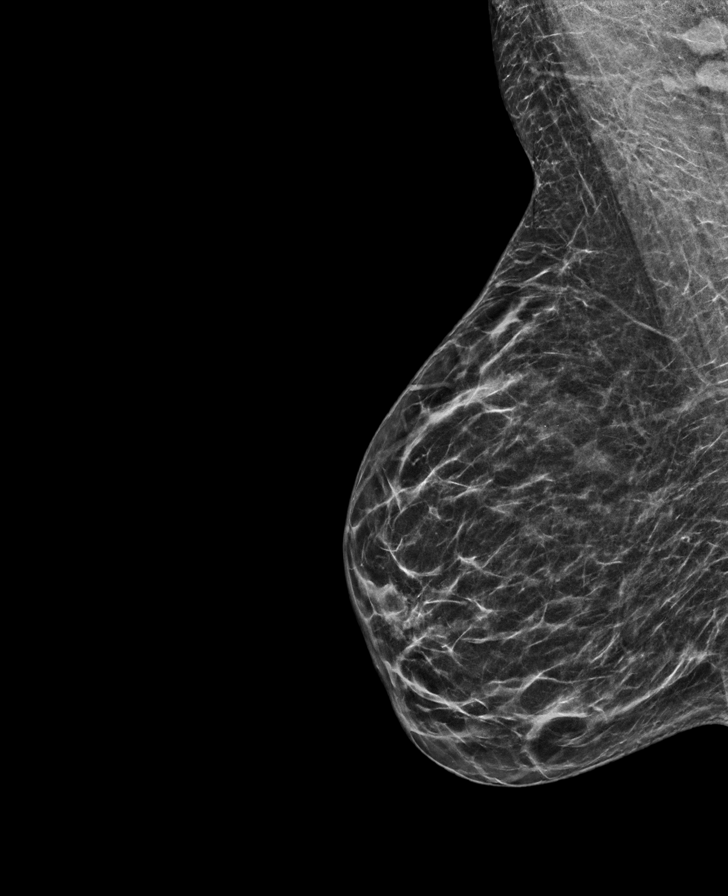

[R CC synth-2D]
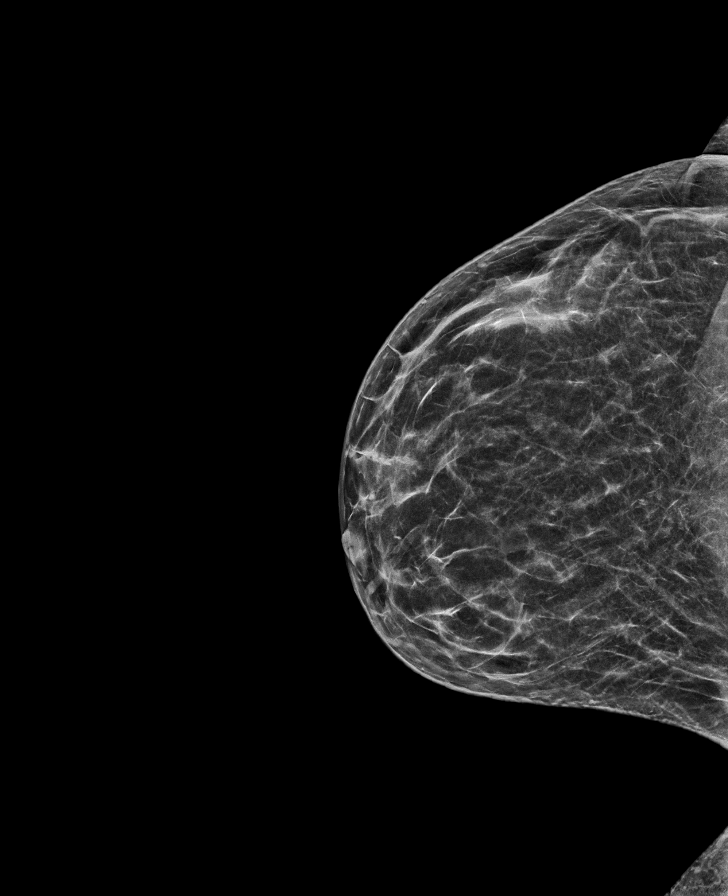

[R CC tomo · tomo slice 27/54.0]
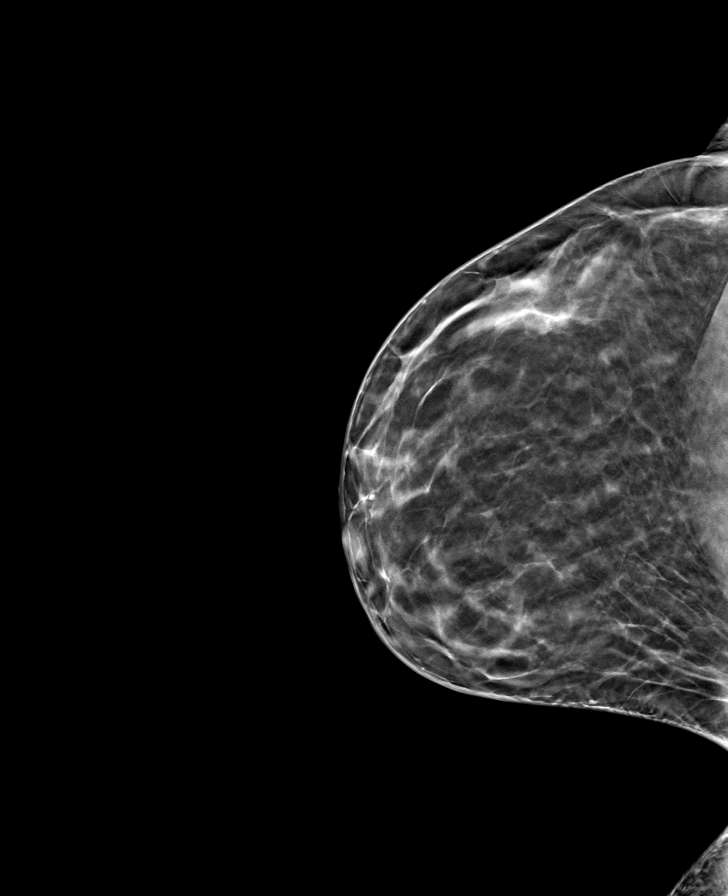

[L MLO tomo · tomo slice 27/53.0]
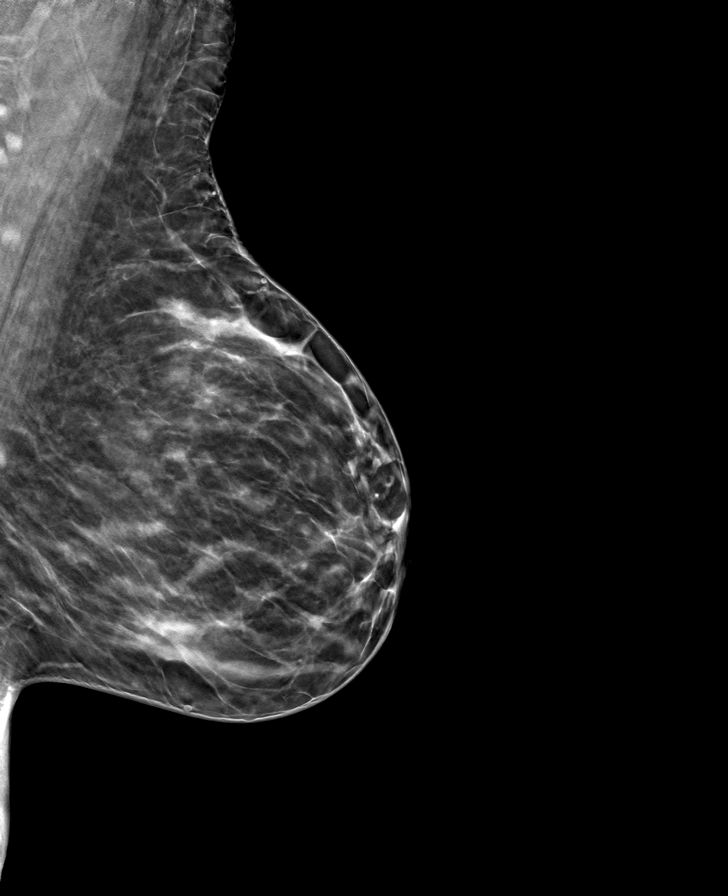

[L CC tomo · tomo slice 29/56.0]
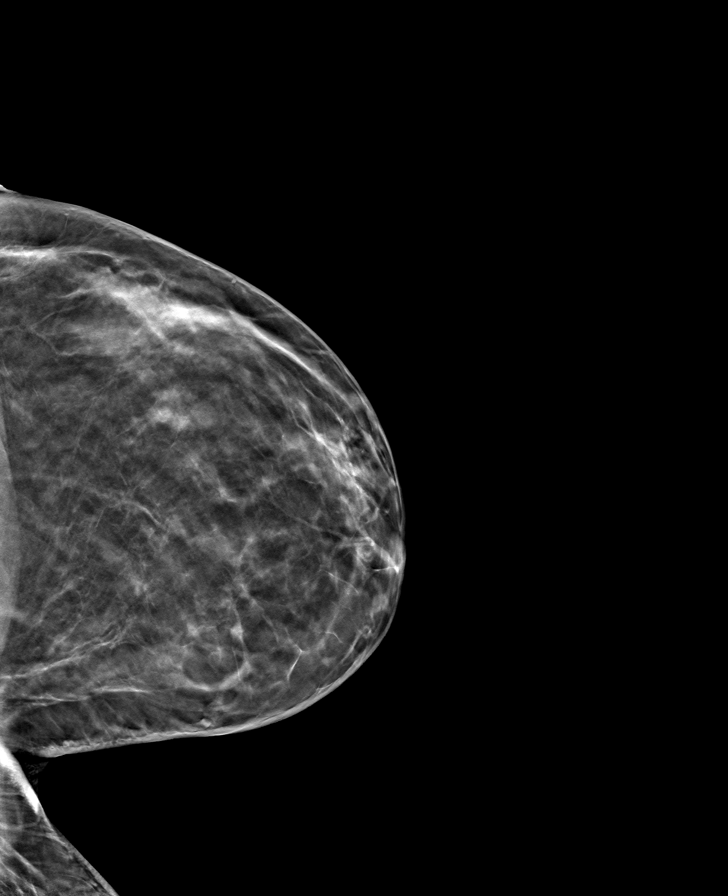

[R MLO tomo · tomo slice 25/49.0]
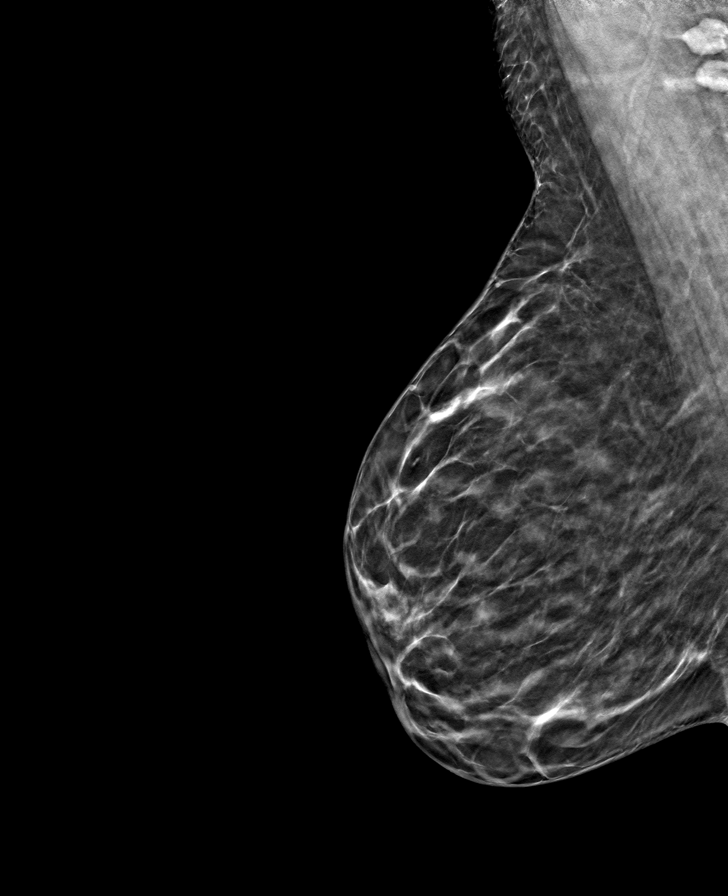

[8 of 24 positions shown; findings below may reference images not displayed]

ACR Breast Density Category c: The breast tissue is heterogeneously
dense, which may obscure small masses.
FINDINGS: There are no findings suspicious for malignancy.
IMPRESSION: No mammographic evidence of malignancy. A result letter of this
screening mammogram will be mailed directly to the patient.

RECOMMENDATION:
Screening mammogram in one year. (Code:Q3-W-BC3)

BI-RADS CATEGORY  1: Negative.

## 2023-03-28 DIAGNOSIS — F4310 Post-traumatic stress disorder, unspecified: Secondary | ICD-10-CM | POA: Diagnosis not present

## 2023-03-28 DIAGNOSIS — F4010 Social phobia, unspecified: Secondary | ICD-10-CM | POA: Diagnosis not present

## 2023-04-03 DIAGNOSIS — F4310 Post-traumatic stress disorder, unspecified: Secondary | ICD-10-CM | POA: Diagnosis not present

## 2023-04-03 DIAGNOSIS — F4010 Social phobia, unspecified: Secondary | ICD-10-CM | POA: Diagnosis not present

## 2023-04-29 DIAGNOSIS — F4010 Social phobia, unspecified: Secondary | ICD-10-CM | POA: Diagnosis not present

## 2023-04-29 DIAGNOSIS — F4310 Post-traumatic stress disorder, unspecified: Secondary | ICD-10-CM | POA: Diagnosis not present

## 2023-05-21 ENCOUNTER — Encounter (INDEPENDENT_AMBULATORY_CARE_PROVIDER_SITE_OTHER): Payer: Self-pay

## 2023-07-22 DIAGNOSIS — F4010 Social phobia, unspecified: Secondary | ICD-10-CM | POA: Diagnosis not present

## 2023-07-22 DIAGNOSIS — F4310 Post-traumatic stress disorder, unspecified: Secondary | ICD-10-CM | POA: Diagnosis not present

## 2023-07-31 DIAGNOSIS — F4310 Post-traumatic stress disorder, unspecified: Secondary | ICD-10-CM | POA: Diagnosis not present

## 2023-07-31 DIAGNOSIS — F4010 Social phobia, unspecified: Secondary | ICD-10-CM | POA: Diagnosis not present

## 2023-08-16 DIAGNOSIS — F4010 Social phobia, unspecified: Secondary | ICD-10-CM | POA: Diagnosis not present

## 2023-08-16 DIAGNOSIS — F4310 Post-traumatic stress disorder, unspecified: Secondary | ICD-10-CM | POA: Diagnosis not present

## 2023-08-20 DIAGNOSIS — F4310 Post-traumatic stress disorder, unspecified: Secondary | ICD-10-CM | POA: Diagnosis not present

## 2023-08-20 DIAGNOSIS — F4010 Social phobia, unspecified: Secondary | ICD-10-CM | POA: Diagnosis not present

## 2023-09-19 ENCOUNTER — Other Ambulatory Visit: Payer: Self-pay | Admitting: Obstetrics and Gynecology

## 2023-09-19 DIAGNOSIS — Z1231 Encounter for screening mammogram for malignant neoplasm of breast: Secondary | ICD-10-CM

## 2023-10-01 ENCOUNTER — Ambulatory Visit (INDEPENDENT_AMBULATORY_CARE_PROVIDER_SITE_OTHER): Payer: BC Managed Care – PPO | Admitting: Nurse Practitioner

## 2023-10-01 ENCOUNTER — Encounter: Payer: Self-pay | Admitting: Nurse Practitioner

## 2023-10-01 VITALS — BP 116/84 | HR 61 | Temp 98.2°F | Ht 66.0 in | Wt 163.4 lb

## 2023-10-01 DIAGNOSIS — Z0000 Encounter for general adult medical examination without abnormal findings: Secondary | ICD-10-CM | POA: Diagnosis not present

## 2023-10-01 DIAGNOSIS — N951 Menopausal and female climacteric states: Secondary | ICD-10-CM | POA: Diagnosis not present

## 2023-10-01 DIAGNOSIS — M25552 Pain in left hip: Secondary | ICD-10-CM | POA: Insufficient documentation

## 2023-10-01 NOTE — Progress Notes (Signed)
 Leron Glance, NP-C Phone: [phone]  Sharon Orozco is a 53 y.o. female who presents today for annual exam.   Discussed the use of AI scribe software for clinical note transcription with the patient, who gave verbal consent to proceed.  History of Present Illness   Sharon Orozco is a 53 year old female who presents for annual exam with continued symptoms of perimenopause.  She experiences irregular menstrual cycles with varying lengths and spotting. Her cycles range from 23 to over 30 days, with spotting lasting up to 12 days. Her last regular period began on September 01, 2023, lasting four to five days, followed by prolonged spotting.  She reports hot flashes, night sweats, and sleep disturbances. She has difficulty sleeping, particularly before her period, often waking up several times a night. Cyclic mood swings are present but less severe than in the past.  She has tried over-the-counter supplements for sleep, such as magnesium and melatonin, but finds them ineffective or causing grogginess. She takes a daily vitamin B12 supplement, which she feels helps with energy levels.  Joint and muscular discomfort, particularly in her hips, is noted, which she attributes to her active lifestyle, including regular exercise such as cardio, weights, and yoga. She has reduced her participation in aerobic kickboxing due to discomfort.  Vaginal dryness is reported, attributed to hormonal changes. She no longer experiences frequent infections that she had previously.  Her family history includes her mother, who stopped menstruating at 44, and she expresses frustration at not having reached menopause yet at 19.      Social History   Tobacco Use  Smoking Status Former   Current packs/day: 0.00   Average packs/day: 0.5 packs/day for 2.0 years (1.0 ttl pk-yrs)   Types: Cigarettes   Quit date: 10/01/1993   Years since quitting: 30.0  Smokeless Tobacco Never    Current Outpatient Medications on File  Prior to Visit  Medication Sig Dispense Refill   Probiotic Product (PROBIOTIC PO) Take by mouth. Olly-one tablet daily     vitamin B-12 (CYANOCOBALAMIN) 100 MCG tablet Take 100 mcg by mouth daily.     No current facility-administered medications on file prior to visit.     ROS see history of present illness  Objective  Physical Exam Vitals:   10/01/23 1445  BP: 116/84  Pulse: 61  Temp: 98.2 F (36.8 C)  SpO2: 99%    BP Readings from Last 3 Encounters:  10/01/23 116/84  09/27/22 112/82  02/26/22 137/87   Wt Readings from Last 3 Encounters:  10/01/23 163 lb 6.4 oz (74.1 kg)  09/27/22 168 lb 3.2 oz (76.3 kg)  02/26/22 169 lb 15.6 oz (77.1 kg)    Physical Exam Constitutional:      General: She is not in acute distress.    Appearance: Normal appearance.  HENT:     Head: Normocephalic.     Right Ear: Tympanic membrane normal.     Left Ear: Tympanic membrane normal.     Nose: Nose normal.     Mouth/Throat:     Mouth: Mucous membranes are moist.     Pharynx: Oropharynx is clear.  Eyes:     Conjunctiva/sclera: Conjunctivae normal.     Pupils: Pupils are equal, round, and reactive to light.  Neck:     Thyroid : No thyromegaly.  Cardiovascular:     Rate and Rhythm: Normal rate and regular rhythm.     Heart sounds: Normal heart sounds.  Pulmonary:     Effort: Pulmonary  effort is normal.     Breath sounds: Normal breath sounds.  Abdominal:     General: Abdomen is flat. Bowel sounds are normal.     Palpations: Abdomen is soft. There is no mass.     Tenderness: There is no abdominal tenderness.  Musculoskeletal:        General: Normal range of motion.  Lymphadenopathy:     Cervical: No cervical adenopathy.  Skin:    General: Skin is warm and dry.     Findings: No rash.  Neurological:     General: No focal deficit present.     Mental Status: She is alert.  Psychiatric:        Mood and Affect: Mood normal.        Behavior: Behavior normal.       Assessment/Plan: Please see individual problem list.  Preventative health care Assessment & Plan: Physical exam complete. Lab work deferred today, she will contact if she would like to have labs completed. Pap smear is up to date, she is followed by Ob-Gyn. Mammogram is scheduled for 10/11/23. She had a negative Cologuard last year. She received her flu vaccine last week. Tetanus vaccine is up to date. She declines additional COVID vaccines. Shingles vaccine series completed. She maintains a healthy lifestyle. Continue with the scheduled mammogram and maintain her current diet and exercise routine. Continue routine dental and eye exams. Return to care in one year, sooner as needed.    Menopausal syndrome Assessment & Plan: Symptoms align with perimenopause/menopausal syndrome. She continues to have monthly cycles varying in duration. She is interested in holistic hormone replacement therapy. Refer to [facility] for holistic management, including hormone replacement therapy. Discuss vaginal estrogen creams and encourage lubricants for dryness. Advise on sleep hygiene for disturbances and discuss low-dose melatonin if needed.    Left hip pain Assessment & Plan: Left hip pain is likely musculoskeletal, possibly due to hip flexor strain or sciatica, related to her exercise regimen. Encourage yoga and stretching for relief. Consider turmeric supplements for joint pain. Advise modifying her exercise routine to prevent exacerbation.      Return in about 1 year (around 09/30/2024) for Annual Exam, sooner as needed.   Leron Glance, NP-C [facility] - [facility]

## 2023-10-01 NOTE — Assessment & Plan Note (Signed)
 Symptoms align with perimenopause/menopausal syndrome. She continues to have monthly cycles varying in duration. She is interested in holistic hormone replacement therapy. Refer to [facility] for holistic management, including hormone replacement therapy. Discuss vaginal estrogen creams and encourage lubricants for dryness. Advise on sleep hygiene for disturbances and discuss low-dose melatonin if needed.

## 2023-10-01 NOTE — Assessment & Plan Note (Signed)
 Left hip pain is likely musculoskeletal, possibly due to hip flexor strain or sciatica, related to her exercise regimen. Encourage yoga and stretching for relief. Consider turmeric supplements for joint pain. Advise modifying her exercise routine to prevent exacerbation.

## 2023-10-01 NOTE — Assessment & Plan Note (Addendum)
 Physical exam complete. Lab work deferred today, she will contact if she would like to have labs completed. Pap smear is up to date, she is followed by Ob-Gyn. Mammogram is scheduled for 10/11/23. She had a negative Cologuard last year. She received her flu vaccine last week. Tetanus vaccine is up to date. She declines additional COVID vaccines. Shingles vaccine series completed. She maintains a healthy lifestyle. Continue with the scheduled mammogram and maintain her current diet and exercise routine. Continue routine dental and eye exams. Return to care in one year, sooner as needed.

## 2023-10-11 ENCOUNTER — Ambulatory Visit
Admission: RE | Admit: 2023-10-11 | Discharge: 2023-10-11 | Disposition: A | Discharge status: Home / Self Care | Source: Ambulatory Visit | Attending: Obstetrics and Gynecology | Admitting: Obstetrics and Gynecology

## 2023-10-11 DIAGNOSIS — Z1231 Encounter for screening mammogram for malignant neoplasm of breast: Secondary | ICD-10-CM | POA: Insufficient documentation

## 2023-11-13 NOTE — Progress Notes (Deleted)
   GYN ENCOUNTER  Subjective  HPI: Sharon Orozco is a 53 y.o. No obstetric history on file. who presents today for ***  Past Medical History:  Diagnosis Date   Acne vulgaris    Allergy    Anxiety    Arthritis    Chronic vaginitis    Depression    Encounter for routine adult medical exam with abnormal findings 09/27/2022   GERD (gastroesophageal reflux disease)    History of chicken pox    Urethral polyp    Urinary tract infection    No past surgical history on file. OB History   No obstetric history on file.    Allergies  Allergen Reactions   Nsaids Shortness Of Breath   Diclofenac  Other (See Comments)    Burning in mouth, Itching, Dry Eyes   Sulfa Antibiotics Rash    Other Reaction(s): Not available    Constitutional: Denied constitutional symptoms, night sweats, recent illness, fatigue, fever, insomnia and weight loss.  Eyes: Denied eye symptoms, eye pain, photophobia, vision change and visual disturbance.  Ears/Nose/Throat/Neck: Denied ear, nose, throat or neck symptoms, hearing loss, nasal discharge, sinus congestion and sore throat.  Cardiovascular: Denied cardiovascular symptoms, arrhythmia, chest pain/pressure, edema, exercise intolerance, orthopnea and palpitations.  Respiratory: Denied pulmonary symptoms, asthma, pleuritic pain, productive sputum, cough, dyspnea and wheezing.  Gastrointestinal: Denied, gastro-esophageal reflux, melena, nausea and vomiting.  Genitourinary:*** Denied genitourinary symptoms including symptomatic vaginal discharge, pelvic relaxation issues, and urinary complaints.  Musculoskeletal: Denied musculoskeletal symptoms, stiffness, swelling, muscle weakness and myalgia.  Dermatologic: Denied dermatology symptoms, rash and scar.  Neurologic: Denied neurology symptoms, dizziness, headache, neck pain and syncope.  Psychiatric: Denied psychiatric symptoms, anxiety and depression.  Endocrine: Denied endocrine symptoms including hot flashes and  night sweats.    Objective  There were no vitals taken for this visit.  Physical examination   Pelvic:   Vulva: Normal appearance.  No lesions.  Vagina: No lesions or abnormalities noted.  Support: Normal pelvic support.  Urethra No masses tenderness or scarring.  Meatus Normal size without lesions or prolapse.  Cervix: Normal appearance.  No lesions.  Anus: Normal exam.  No lesions.  Perineum: Normal exam.  No lesions.        Bimanual   Uterus: Normal size.  Non-tender.  Mobile.  AV.  Adnexae: No masses.  Non-tender to palpation.  Cul-de-sac: Negative for abnormality.    Assessment   Plan    Eleanor Canny, CNM

## 2023-11-17 ENCOUNTER — Encounter: Payer: Self-pay | Admitting: Nurse Practitioner

## 2023-11-18 ENCOUNTER — Encounter: Payer: Self-pay | Admitting: Obstetrics

## 2024-01-03 ENCOUNTER — Encounter: Admitting: Obstetrics

## 2024-01-23 ENCOUNTER — Encounter: Admitting: Obstetrics

## 2024-03-04 ENCOUNTER — Encounter: Admitting: Obstetrics

## 2024-10-02 ENCOUNTER — Encounter: Admitting: Nurse Practitioner
